# Patient Record
Sex: Male | Born: 1940
Health system: Southern US, Community
[De-identification: ages and names within clinical notes are randomized; demographics above are authoritative.]

## PROBLEM LIST (undated history)

## (undated) DIAGNOSIS — M1990 Unspecified osteoarthritis, unspecified site: Secondary | ICD-10-CM

## (undated) DIAGNOSIS — K219 Gastro-esophageal reflux disease without esophagitis: Secondary | ICD-10-CM

## (undated) DIAGNOSIS — H269 Unspecified cataract: Secondary | ICD-10-CM

## (undated) DIAGNOSIS — J189 Pneumonia, unspecified organism: Secondary | ICD-10-CM

## (undated) DIAGNOSIS — E7800 Pure hypercholesterolemia, unspecified: Secondary | ICD-10-CM

## (undated) DIAGNOSIS — C439 Malignant melanoma of skin, unspecified: Secondary | ICD-10-CM

## (undated) DIAGNOSIS — E119 Type 2 diabetes mellitus without complications: Secondary | ICD-10-CM

## (undated) DIAGNOSIS — F0390 Unspecified dementia without behavioral disturbance: Secondary | ICD-10-CM

## (undated) HISTORY — PX: HERNIA REPAIR: SHX51

## (undated) HISTORY — DX: Unspecified cataract: H26.9

## (undated) HISTORY — PX: CATARACT EXTRACTION, BILATERAL: SHX1313

## (undated) HISTORY — PX: GLAUCOMA SURGERY: SHX656

## (undated) HISTORY — DX: Unspecified osteoarthritis, unspecified site: M19.90

## (undated) HISTORY — DX: Gastro-esophageal reflux disease without esophagitis: K21.9

## (undated) HISTORY — DX: Pneumonia, unspecified organism: J18.9

## (undated) HISTORY — PX: TONSILLECTOMY: SUR1361

## (undated) HISTORY — PX: EYE SURGERY: SHX253

## (undated) HISTORY — DX: Pure hypercholesterolemia, unspecified: E78.00

---

## 2004-04-01 ENCOUNTER — Emergency Department (HOSPITAL_COMMUNITY): Admission: EM | Admit: 2004-04-01 | Discharge: 2004-04-01 | Payer: Self-pay | Admitting: Emergency Medicine

## 2004-04-01 ENCOUNTER — Inpatient Hospital Stay (HOSPITAL_COMMUNITY): Admission: EM | Admit: 2004-04-01 | Discharge: 2004-04-05 | Payer: Self-pay | Admitting: Psychiatry

## 2004-04-01 ENCOUNTER — Ambulatory Visit: Payer: Self-pay | Admitting: Psychiatry

## 2004-09-15 ENCOUNTER — Ambulatory Visit: Payer: Self-pay | Admitting: Family Medicine

## 2004-10-02 ENCOUNTER — Ambulatory Visit: Payer: Self-pay | Admitting: Family Medicine

## 2004-10-23 ENCOUNTER — Ambulatory Visit: Payer: Self-pay | Admitting: Family Medicine

## 2004-11-03 ENCOUNTER — Ambulatory Visit: Payer: Self-pay | Admitting: Family Medicine

## 2004-11-12 ENCOUNTER — Ambulatory Visit: Payer: Self-pay | Admitting: Family Medicine

## 2010-05-18 DIAGNOSIS — H269 Unspecified cataract: Secondary | ICD-10-CM

## 2010-05-18 HISTORY — DX: Unspecified cataract: H26.9

## 2013-11-15 ENCOUNTER — Encounter (HOSPITAL_COMMUNITY): Payer: Self-pay | Admitting: Emergency Medicine

## 2013-11-15 ENCOUNTER — Emergency Department (HOSPITAL_COMMUNITY): Payer: Medicare HMO

## 2013-11-15 ENCOUNTER — Emergency Department (HOSPITAL_COMMUNITY)
Admission: EM | Admit: 2013-11-15 | Discharge: 2013-11-15 | Disposition: A | Discharge status: Home / Self Care | Payer: Medicare HMO | Attending: Emergency Medicine | Admitting: Emergency Medicine

## 2013-11-15 DIAGNOSIS — W19XXXA Unspecified fall, initial encounter: Secondary | ICD-10-CM

## 2013-11-15 DIAGNOSIS — R296 Repeated falls: Secondary | ICD-10-CM | POA: Insufficient documentation

## 2013-11-15 DIAGNOSIS — S0081XA Abrasion of other part of head, initial encounter: Secondary | ICD-10-CM

## 2013-11-15 DIAGNOSIS — Z23 Encounter for immunization: Secondary | ICD-10-CM | POA: Insufficient documentation

## 2013-11-15 DIAGNOSIS — S022XXA Fracture of nasal bones, initial encounter for closed fracture: Secondary | ICD-10-CM | POA: Insufficient documentation

## 2013-11-15 DIAGNOSIS — Y9301 Activity, walking, marching and hiking: Secondary | ICD-10-CM | POA: Insufficient documentation

## 2013-11-15 DIAGNOSIS — IMO0002 Reserved for concepts with insufficient information to code with codable children: Secondary | ICD-10-CM | POA: Insufficient documentation

## 2013-11-15 DIAGNOSIS — E119 Type 2 diabetes mellitus without complications: Secondary | ICD-10-CM | POA: Insufficient documentation

## 2013-11-15 DIAGNOSIS — Y92410 Unspecified street and highway as the place of occurrence of the external cause: Secondary | ICD-10-CM | POA: Insufficient documentation

## 2013-11-15 HISTORY — DX: Type 2 diabetes mellitus without complications: E11.9

## 2013-11-15 LAB — URINALYSIS, ROUTINE W REFLEX MICROSCOPIC
Bilirubin Urine: NEGATIVE
Glucose, UA: >1000 mg/dL — AB
HGB URINE DIPSTICK: NEGATIVE
KETONES UR: NEGATIVE mg/dL
Leukocytes, UA: NEGATIVE
Nitrite: NEGATIVE
PROTEIN: 100 mg/dL — AB
Specific Gravity, Urine: 1.025 (ref 1.005–1.030)
UROBILINOGEN UA: 0.2 mg/dL (ref 0.0–1.0)
pH: 6 (ref 5.0–8.0)

## 2013-11-15 LAB — URINE MICROSCOPIC-ADD ON

## 2013-11-15 LAB — CBG MONITORING, ED: Glucose-Capillary: 266 mg/dL — ABNORMAL HIGH (ref 70–99)

## 2013-11-15 MED ORDER — "THROMBI-PAD 3""X3"" EX PADS"
1.0000 | MEDICATED_PAD | Freq: Once | CUTANEOUS | Status: AC
  Administered 2013-11-15: 1 via TOPICAL
  Filled 2013-11-15: qty 1

## 2013-11-15 MED ORDER — TETANUS-DIPHTH-ACELL PERTUSSIS 5-2.5-18.5 LF-MCG/0.5 IM SUSP
0.5000 mL | Freq: Once | INTRAMUSCULAR | Status: AC
  Administered 2013-11-15: 0.5 mL via INTRAMUSCULAR
  Filled 2013-11-15: qty 0.5

## 2013-11-15 MED ORDER — BACITRACIN ZINC 500 UNIT/GM EX OINT: 1.0000 "application " | TOPICAL_OINTMENT | Freq: Two times a day (BID) | CUTANEOUS | Status: DC

## 2013-11-15 NOTE — ED Provider Notes (Signed)
1150 - Patient care assumed from Charlann Lange, PA-C at shift change. Patient presenting after a mechanical fall. UA and imaging pending. Plan included d/c if imaging findings nonemergent. Imaging findings reviewed which are significant for nondisplaced L nasal bone fracture as well as L frontal scalp contusion. No other acute findings on imaging today. UA does not suggest infection. Patient has ambulated in the ED without difficulty or unsteady gait. He is stable for d/c at this time with wound care instructions. Return precautions provided and patient agreeable to plan with no unaddressed concerns.  Filed Vitals:   11/15/13 0910 11/15/13 1000 11/15/13 1025 11/15/13 1100  BP: 165/89 164/77 160/72 161/86  Pulse: 78 75 75 77  Temp:   97.6 F (36.4 C)   TempSrc:   Oral   Resp: 18 18 16 22   SpO2: 94% 93% 96% 97%   Results for orders placed during the hospital encounter of 11/15/13  URINALYSIS, ROUTINE W REFLEX MICROSCOPIC      Result Value Ref Range   Color, Urine YELLOW  YELLOW   APPearance CLEAR  CLEAR   Specific Gravity, Urine 1.025  1.005 - 1.030   pH 6.0  5.0 - 8.0   Glucose, UA >1000 (*) NEGATIVE mg/dL   Hgb urine dipstick NEGATIVE  NEGATIVE   Bilirubin Urine NEGATIVE  NEGATIVE   Ketones, ur NEGATIVE  NEGATIVE mg/dL   Protein, ur 100 (*) NEGATIVE mg/dL   Urobilinogen, UA 0.2  0.0 - 1.0 mg/dL   Nitrite NEGATIVE  NEGATIVE   Leukocytes, UA NEGATIVE  NEGATIVE  URINE MICROSCOPIC-ADD ON      Result Value Ref Range   Squamous Epithelial / LPF RARE  RARE   WBC, UA 0-2  <3 WBC/hpf   Casts HYALINE CASTS (*) NEGATIVE  CBG MONITORING, ED      Result Value Ref Range   Glucose-Capillary 266 (*) 70 - 99 mg/dL   Ct Head Wo Contrast  11/15/2013   CLINICAL DATA:  73 year old male with history of trauma from a fall. Injury to the head.  EXAM: CT HEAD WITHOUT CONTRAST  CT MAXILLOFACIAL WITHOUT CONTRAST  CT CERVICAL SPINE WITHOUT CONTRAST  TECHNIQUE: Multidetector CT imaging of the head, cervical  spine, and maxillofacial structures were performed using the standard protocol without intravenous contrast. Multiplanar CT image reconstructions of the cervical spine and maxillofacial structures were also generated.  COMPARISON:  No priors.  FINDINGS: CT HEAD FINDINGS  Soft tissue swelling in the left frontal scalp, compatible with a soft tissue contusion. This extends into the medial aspect of the left periorbital region. Mild cerebral atrophy. Patchy and confluent areas of decreased attenuation are noted throughout the deep and periventricular white matter of the cerebral hemispheres bilaterally, compatible with chronic microvascular ischemic disease. No acute displaced skull fractures are identified. No acute intracranial abnormality. Specifically, no evidence of acute post-traumatic intracranial hemorrhage, no definite regions of acute/subacute cerebral ischemia, no focal mass, mass effect, hydrocephalus or abnormal intra or extra-axial fluid collections. The visualized paranasal sinuses and mastoids are well pneumatized.  CT MAXILLOFACIAL FINDINGS  Soft tissue swelling in the left frontal scalp extending in the medial aspect of the left periorbital region. Left globe and retro-orbital soft tissues are grossly normal in appearance. No left orbital fracture. There is a nondisplaced fracture through the left nasal bones. No other acute displaced facial bone fractures are noted. Specifically, pterygoid plates are intact. Mandibular condyles are located. Paranasal sinuses are well pneumatized.  CT CERVICAL SPINE FINDINGS  No acute displaced  fractures of the cervical spine. Alignment is anatomic. Prevertebral soft tissues are normal. Mild multilevel degenerative disc disease, most pronounced at C2-C3 and C3-C4. Mild multilevel facet arthropathy. Visualized portions of the upper thorax are unremarkable.  IMPRESSION: 1. Nondisplaced fracture of the left nasal bones. 2. Contusion/laceration in the left frontal scalp  extending into the medial aspect of the left periorbital region. The left globe and retro-orbital soft tissues are grossly normal in appearance. 3. No other acute displaced facial bone fractures. 4. No acute displaced skull fracture or findings of significant acute intracranial trauma. 5. No evidence of significant acute traumatic injury to the cervical spine. 6. Mild cerebral atrophy with chronic microvascular ischemic changes in the cerebral white matter, as above. 7. Mild multilevel degenerative disc disease and cervical spondylosis, as above.   Electronically Signed   By: Vinnie Langton M.D.   On: 11/15/2013 10:45   Ct Cervical Spine Wo Contrast  11/15/2013   CLINICAL DATA:  73 year old male with history of trauma from a fall. Injury to the head.  EXAM: CT HEAD WITHOUT CONTRAST  CT MAXILLOFACIAL WITHOUT CONTRAST  CT CERVICAL SPINE WITHOUT CONTRAST  TECHNIQUE: Multidetector CT imaging of the head, cervical spine, and maxillofacial structures were performed using the standard protocol without intravenous contrast. Multiplanar CT image reconstructions of the cervical spine and maxillofacial structures were also generated.  COMPARISON:  No priors.  FINDINGS: CT HEAD FINDINGS  Soft tissue swelling in the left frontal scalp, compatible with a soft tissue contusion. This extends into the medial aspect of the left periorbital region. Mild cerebral atrophy. Patchy and confluent areas of decreased attenuation are noted throughout the deep and periventricular white matter of the cerebral hemispheres bilaterally, compatible with chronic microvascular ischemic disease. No acute displaced skull fractures are identified. No acute intracranial abnormality. Specifically, no evidence of acute post-traumatic intracranial hemorrhage, no definite regions of acute/subacute cerebral ischemia, no focal mass, mass effect, hydrocephalus or abnormal intra or extra-axial fluid collections. The visualized paranasal sinuses and mastoids  are well pneumatized.  CT MAXILLOFACIAL FINDINGS  Soft tissue swelling in the left frontal scalp extending in the medial aspect of the left periorbital region. Left globe and retro-orbital soft tissues are grossly normal in appearance. No left orbital fracture. There is a nondisplaced fracture through the left nasal bones. No other acute displaced facial bone fractures are noted. Specifically, pterygoid plates are intact. Mandibular condyles are located. Paranasal sinuses are well pneumatized.  CT CERVICAL SPINE FINDINGS  No acute displaced fractures of the cervical spine. Alignment is anatomic. Prevertebral soft tissues are normal. Mild multilevel degenerative disc disease, most pronounced at C2-C3 and C3-C4. Mild multilevel facet arthropathy. Visualized portions of the upper thorax are unremarkable.  IMPRESSION: 1. Nondisplaced fracture of the left nasal bones. 2. Contusion/laceration in the left frontal scalp extending into the medial aspect of the left periorbital region. The left globe and retro-orbital soft tissues are grossly normal in appearance. 3. No other acute displaced facial bone fractures. 4. No acute displaced skull fracture or findings of significant acute intracranial trauma. 5. No evidence of significant acute traumatic injury to the cervical spine. 6. Mild cerebral atrophy with chronic microvascular ischemic changes in the cerebral white matter, as above. 7. Mild multilevel degenerative disc disease and cervical spondylosis, as above.   Electronically Signed   By: Vinnie Langton M.D.   On: 11/15/2013 10:45   Ct Maxillofacial Wo Cm  11/15/2013   CLINICAL DATA:  73 year old male with history of trauma from a fall.  Injury to the head.  EXAM: CT HEAD WITHOUT CONTRAST  CT MAXILLOFACIAL WITHOUT CONTRAST  CT CERVICAL SPINE WITHOUT CONTRAST  TECHNIQUE: Multidetector CT imaging of the head, cervical spine, and maxillofacial structures were performed using the standard protocol without intravenous  contrast. Multiplanar CT image reconstructions of the cervical spine and maxillofacial structures were also generated.  COMPARISON:  No priors.  FINDINGS: CT HEAD FINDINGS  Soft tissue swelling in the left frontal scalp, compatible with a soft tissue contusion. This extends into the medial aspect of the left periorbital region. Mild cerebral atrophy. Patchy and confluent areas of decreased attenuation are noted throughout the deep and periventricular white matter of the cerebral hemispheres bilaterally, compatible with chronic microvascular ischemic disease. No acute displaced skull fractures are identified. No acute intracranial abnormality. Specifically, no evidence of acute post-traumatic intracranial hemorrhage, no definite regions of acute/subacute cerebral ischemia, no focal mass, mass effect, hydrocephalus or abnormal intra or extra-axial fluid collections. The visualized paranasal sinuses and mastoids are well pneumatized.  CT MAXILLOFACIAL FINDINGS  Soft tissue swelling in the left frontal scalp extending in the medial aspect of the left periorbital region. Left globe and retro-orbital soft tissues are grossly normal in appearance. No left orbital fracture. There is a nondisplaced fracture through the left nasal bones. No other acute displaced facial bone fractures are noted. Specifically, pterygoid plates are intact. Mandibular condyles are located. Paranasal sinuses are well pneumatized.  CT CERVICAL SPINE FINDINGS  No acute displaced fractures of the cervical spine. Alignment is anatomic. Prevertebral soft tissues are normal. Mild multilevel degenerative disc disease, most pronounced at C2-C3 and C3-C4. Mild multilevel facet arthropathy. Visualized portions of the upper thorax are unremarkable.  IMPRESSION: 1. Nondisplaced fracture of the left nasal bones. 2. Contusion/laceration in the left frontal scalp extending into the medial aspect of the left periorbital region. The left globe and retro-orbital  soft tissues are grossly normal in appearance. 3. No other acute displaced facial bone fractures. 4. No acute displaced skull fracture or findings of significant acute intracranial trauma. 5. No evidence of significant acute traumatic injury to the cervical spine. 6. Mild cerebral atrophy with chronic microvascular ischemic changes in the cerebral white matter, as above. 7. Mild multilevel degenerative disc disease and cervical spondylosis, as above.   Electronically Signed   By: Vinnie Langton M.D.   On: 11/15/2013 10:45      Antonietta Breach, PA-C 11/15/13 1214

## 2013-11-15 NOTE — ED Provider Notes (Signed)
Medical screening examination/treatment/procedure(s) were performed by non-physician practitioner and as supervising physician I was immediately available for consultation/collaboration.  Richarda Blade, MD 11/15/13 [phone]

## 2013-11-15 NOTE — ED Provider Notes (Signed)
CSN: [id number]     Arrival date & time 11/15/13  0856 History   First MD Initiated Contact with Patient 11/15/13 [phone]     Chief Complaint  Patient presents with  . Fall  . Head Laceration     (Consider location/radiation/quality/duration/timing/severity/associated sxs/prior Treatment) Patient is a 73 y.o. male presenting with fall and scalp laceration. The history is provided by the patient and the EMS personnel. No language interpreter was used.  Fall This is a new problem. The current episode started today. Pertinent negatives include no abdominal pain, chest pain, chills, fever, headaches or vomiting. Associated symptoms comments: He fell while walking and losing his balance. He walks with a cane and reports frequent loss of balance without dizziness, pain or shortness of breath. He fell face forward onto cement causing facial injury. No LOC, neck pain, chest/abdominal pain. He denies nausea or vomiting.Marland Kitchen  Head Laceration Pertinent negatives include no abdominal pain, chest pain, chills, fever, headaches or vomiting.    Past Medical History  Diagnosis Date  . Diabetes mellitus without complication    Past Surgical History  Procedure Laterality Date  . Hernia repair      double  . Tonsillectomy    . Eye surgery    . Cataract extraction, bilateral    . Glaucoma surgery     No family history on file. History  Substance Use Topics  . Smoking status: Never Smoker   . Smokeless tobacco: Not on file  . Alcohol Use: No    Review of Systems  Constitutional: Negative for fever and chills.  Respiratory: Negative.  Negative for shortness of breath.   Cardiovascular: Negative.  Negative for chest pain.  Gastrointestinal: Negative.  Negative for vomiting and abdominal pain.  Musculoskeletal: Negative.  Negative for back pain.  Skin: Positive for wound.  Neurological: Negative.  Negative for syncope, speech difficulty and headaches.      Allergies  Metformin and  related  Home Medications   Prior to Admission medications   Not on File   BP 165/89  Pulse 78  Temp(Src) 97.3 F (36.3 C) (Oral)  Resp 18  SpO2 94% Physical Exam  Constitutional: He is oriented to person, place, and time. He appears well-developed and well-nourished. No distress.  HENT:  Head: Normocephalic.  Facial abrasions over left eye, left lateral nose and upper lip. Minimal swelling or tenderness.   Eyes: Conjunctivae are normal. Pupils are equal, round, and reactive to light.  Neck: Normal range of motion. Neck supple.  Cardiovascular: Normal rate and regular rhythm.   Pulmonary/Chest: Effort normal and breath sounds normal.  Abdominal: Soft. Bowel sounds are normal. There is no tenderness. There is no rebound and no guarding.  Musculoskeletal: Normal range of motion.  No midline or paraspinal tenderness to palpation. No swelling of neck or back. FROM all extremities  Neurological: He is alert and oriented to person, place, and time.  Skin: Skin is warm and dry.  Psychiatric: He has a normal mood and affect.    ED Course  Procedures (including critical care time) Labs Review Labs Reviewed - No data to display  Imaging Review No results found.   EKG Interpretation None      MDM   Final diagnoses:  None    1. Fall 2. Facial abrasions  Nonsuturable abrasions to face. CT's pending to evaluate for head/neck injury or facial fractures. Patient care transferred to Dr. Eulis Foster pending CT results.     Dewaine Oats, PA-C 11/15/13 1013

## 2013-11-15 NOTE — ED Notes (Signed)
Notified RN of CBG 266

## 2013-11-15 NOTE — ED Notes (Signed)
73 yo male walking on [address]. Feel on the side walk and hit head. No LOC, recalls event. Left Eyebrow 1/2 lac bleeding not on any blood thinners. Pressure dressing applied via EMS. Hx Diabetes.A/O x3    160/80 CBG 136 post meal

## 2013-11-15 NOTE — ED Provider Notes (Signed)
Medical screening examination/treatment/procedure(s) were performed by non-physician practitioner and as supervising physician I was immediately available for consultation/collaboration.  Richarda Blade, MD 11/15/13 [phone]

## 2013-11-15 NOTE — ED Notes (Signed)
Patient transported to CT 

## 2013-11-15 NOTE — Discharge Instructions (Signed)
Recommend topical bacitracin as prescribed for proper wound healing. Recommend Tylenol as needed for pain control. Recommend ice to areas of injury to prevent swelling and to improve pain. Followup with your primary doctor.  Abrasions An abrasion is a cut or scrape of the skin. Abrasions do not go through all layers of the skin. HOME CARE  If a bandage (dressing) was put on your wound, change it as told by your doctor. If the bandage sticks, soak it off with warm.  Wash the area with water and soap 2 times a day. Rinse off the soap. Pat the area dry with a clean towel.  Put on medicated cream (ointment) as told by your doctor.  Change your bandage right away if it gets wet or dirty.  Only take medicine as told by your doctor.  See your doctor within 24-48 hours to get your wound checked.  Check your wound for redness, puffiness (swelling), or yellowish-white fluid (pus). GET HELP RIGHT AWAY IF:   You have more pain in the wound.  You have redness, swelling, or tenderness around the wound.  You have pus coming from the wound.  You have a fever or lasting symptoms for more than 2-3 days.  You have a fever and your symptoms suddenly get worse.  You have a bad smell coming from the wound or bandage. MAKE SURE YOU:   Understand these instructions.  Will watch your condition.  Will get help right away if you are not doing well or get worse. Document Released: 10/21/2007 Document Revised: 01/27/2012 Document Reviewed: 04/07/2011 [facility] Patient Information 2015 [place], [place]. This information is not intended to replace advice given to you by your health care provider. Make sure you discuss any questions you have with your health care provider.  Wound Care Wound care helps prevent pain and infection.  You may need a tetanus shot if:  You cannot remember when you had your last tetanus shot.  You have never had a tetanus shot.  The injury broke your skin. If you need a  tetanus shot and you choose not to have one, you may get tetanus. Sickness from tetanus can be serious. HOME CARE   Only take medicine as told by your doctor.  Clean the wound daily with mild soap and water.  Change any bandages (dressings) as told by your doctor.  Put medicated cream and a bandage on the wound as told by your doctor.  Change the bandage if it gets wet, dirty, or starts to smell.  Take showers. Do not take baths, swim, or do anything that puts your wound under water.  Rest and raise (elevate) the wound until the pain and puffiness (swelling) are better.  Keep all doctor visits as told. GET HELP RIGHT AWAY IF:   Yellowish-white fluid (pus) comes from the wound.  Medicine does not lessen your pain.  There is a red streak going away from the wound.  You have a fever. MAKE SURE YOU:   Understand these instructions.  Will watch your condition.  Will get help right away if you are not doing well or get worse. Document Released: 02/11/2008 Document Revised: 07/27/2011 Document Reviewed: 09/07/2010 [facility] Patient Information 2015 [place], [place]. This information is not intended to replace advice given to you by your health care provider. Make sure you discuss any questions you have with your health care provider.

## 2014-04-24 ENCOUNTER — Emergency Department (HOSPITAL_COMMUNITY): Payer: Medicare HMO

## 2014-04-24 ENCOUNTER — Inpatient Hospital Stay (HOSPITAL_COMMUNITY)
Admission: EM | Admit: 2014-04-24 | Discharge: 2014-04-30 | DRG: 871 | Disposition: A | Discharge status: Home Health | Payer: Medicare HMO | Attending: Internal Medicine | Admitting: Internal Medicine

## 2014-04-24 ENCOUNTER — Encounter (HOSPITAL_COMMUNITY): Payer: Self-pay

## 2014-04-24 ENCOUNTER — Inpatient Hospital Stay (HOSPITAL_COMMUNITY): Payer: Medicare HMO

## 2014-04-24 DIAGNOSIS — H409 Unspecified glaucoma: Secondary | ICD-10-CM | POA: Diagnosis not present

## 2014-04-24 DIAGNOSIS — E785 Hyperlipidemia, unspecified: Secondary | ICD-10-CM | POA: Diagnosis present

## 2014-04-24 DIAGNOSIS — E7220 Disorder of urea cycle metabolism, unspecified: Secondary | ICD-10-CM | POA: Diagnosis not present

## 2014-04-24 DIAGNOSIS — E872 Acidosis: Secondary | ICD-10-CM | POA: Diagnosis present

## 2014-04-24 DIAGNOSIS — R680 Hypothermia, not associated with low environmental temperature: Secondary | ICD-10-CM | POA: Diagnosis not present

## 2014-04-24 DIAGNOSIS — J189 Pneumonia, unspecified organism: Secondary | ICD-10-CM

## 2014-04-24 DIAGNOSIS — Z794 Long term (current) use of insulin: Secondary | ICD-10-CM | POA: Diagnosis not present

## 2014-04-24 DIAGNOSIS — G9340 Encephalopathy, unspecified: Secondary | ICD-10-CM | POA: Diagnosis present

## 2014-04-24 DIAGNOSIS — R451 Restlessness and agitation: Secondary | ICD-10-CM | POA: Diagnosis not present

## 2014-04-24 DIAGNOSIS — E162 Hypoglycemia, unspecified: Secondary | ICD-10-CM | POA: Diagnosis present

## 2014-04-24 DIAGNOSIS — J690 Pneumonitis due to inhalation of food and vomit: Secondary | ICD-10-CM

## 2014-04-24 DIAGNOSIS — A419 Sepsis, unspecified organism: Principal | ICD-10-CM

## 2014-04-24 DIAGNOSIS — E11649 Type 2 diabetes mellitus with hypoglycemia without coma: Secondary | ICD-10-CM | POA: Diagnosis not present

## 2014-04-24 DIAGNOSIS — T68XXXA Hypothermia, initial encounter: Secondary | ICD-10-CM

## 2014-04-24 DIAGNOSIS — Z85820 Personal history of malignant melanoma of skin: Secondary | ICD-10-CM

## 2014-04-24 DIAGNOSIS — E119 Type 2 diabetes mellitus without complications: Secondary | ICD-10-CM

## 2014-04-24 HISTORY — DX: Malignant melanoma of skin, unspecified: C43.9

## 2014-04-24 LAB — ETHANOL

## 2014-04-24 LAB — CBC WITH DIFFERENTIAL/PLATELET
Basophils Absolute: 0 10*3/uL (ref 0.0–0.1)
Basophils Relative: 0 % (ref 0–1)
EOS ABS: 0 10*3/uL (ref 0.0–0.7)
Eosinophils Relative: 0 % (ref 0–5)
HCT: 58 % — ABNORMAL HIGH (ref 39.0–52.0)
HEMOGLOBIN: 20.9 g/dL — AB (ref 13.0–17.0)
Lymphocytes Relative: 4 % — ABNORMAL LOW (ref 12–46)
Lymphs Abs: 0.8 10*3/uL (ref 0.7–4.0)
MCH: 30.4 pg (ref 26.0–34.0)
MCHC: 36 g/dL (ref 30.0–36.0)
MCV: 84.3 fL (ref 78.0–100.0)
MONOS PCT: 8 % (ref 3–12)
Monocytes Absolute: 1.8 10*3/uL — ABNORMAL HIGH (ref 0.1–1.0)
NEUTROS PCT: 88 % — AB (ref 43–77)
Neutro Abs: 20.4 10*3/uL — ABNORMAL HIGH (ref 1.7–7.7)
Platelets: 189 10*3/uL (ref 150–400)
RBC: 6.88 MIL/uL — AB (ref 4.22–5.81)
RDW: 13.8 % (ref 11.5–15.5)
WBC: 23.1 10*3/uL — ABNORMAL HIGH (ref 4.0–10.5)

## 2014-04-24 LAB — RAPID URINE DRUG SCREEN, HOSP PERFORMED
AMPHETAMINES: NOT DETECTED
Barbiturates: NOT DETECTED
Benzodiazepines: NOT DETECTED
Cocaine: NOT DETECTED
OPIATES: NOT DETECTED
TETRAHYDROCANNABINOL: NOT DETECTED

## 2014-04-24 LAB — GLUCOSE, CAPILLARY
GLUCOSE-CAPILLARY: 57 mg/dL — AB (ref 70–99)
GLUCOSE-CAPILLARY: 78 mg/dL (ref 70–99)
Glucose-Capillary: 73 mg/dL (ref 70–99)
Glucose-Capillary: 55 mg/dL — ABNORMAL LOW (ref 70–99)
Glucose-Capillary: 134 mg/dL — ABNORMAL HIGH (ref 70–99)
Glucose-Capillary: 104 mg/dL — ABNORMAL HIGH (ref 70–99)
Glucose-Capillary: 177 mg/dL — ABNORMAL HIGH (ref 70–99)

## 2014-04-24 LAB — I-STAT ARTERIAL BLOOD GAS, ED
ACID-BASE DEFICIT: 7 mmol/L — AB (ref 0.0–2.0)
Bicarbonate: 18 mEq/L — ABNORMAL LOW (ref 20.0–24.0)
O2 Saturation: 90 %
Patient temperature: 95.8
TCO2: 19 mmol/L (ref 0–100)
pCO2 arterial: 33.8 mmHg — ABNORMAL LOW (ref 35.0–45.0)
pH, Arterial: 7.327 — ABNORMAL LOW (ref 7.350–7.450)
pO2, Arterial: 58 mmHg — ABNORMAL LOW (ref 80.0–100.0)

## 2014-04-24 LAB — COMPREHENSIVE METABOLIC PANEL
ALK PHOS: 115 U/L (ref 39–117)
ALT: 92 U/L — ABNORMAL HIGH (ref 0–53)
ANION GAP: 16 — AB (ref 5–15)
AST: 64 U/L — ABNORMAL HIGH (ref 0–37)
Albumin: 3.5 g/dL (ref 3.5–5.2)
BUN: 26 mg/dL — AB (ref 6–23)
CO2: 23 mEq/L (ref 19–32)
CREATININE: 0.89 mg/dL (ref 0.50–1.35)
Calcium: 8.8 mg/dL (ref 8.4–10.5)
Chloride: 101 mEq/L (ref 96–112)
GFR calc non Af Amer: 84 mL/min — ABNORMAL LOW (ref >90)
Glucose, Bld: 123 mg/dL — ABNORMAL HIGH (ref 70–99)
Potassium: 4.2 mEq/L (ref 3.7–5.3)
Sodium: 140 mEq/L (ref 137–147)
TOTAL PROTEIN: 7.2 g/dL (ref 6.0–8.3)
Total Bilirubin: 0.7 mg/dL (ref 0.3–1.2)

## 2014-04-24 LAB — URINALYSIS, ROUTINE W REFLEX MICROSCOPIC
Bilirubin Urine: NEGATIVE
Glucose, UA: 250 mg/dL — AB
Ketones, ur: NEGATIVE mg/dL
Leukocytes, UA: NEGATIVE
NITRITE: NEGATIVE
Specific Gravity, Urine: 1.021 (ref 1.005–1.030)
Urobilinogen, UA: 0.2 mg/dL (ref 0.0–1.0)
pH: 6.5 (ref 5.0–8.0)

## 2014-04-24 LAB — AMMONIA: Ammonia: 79 umol/L — ABNORMAL HIGH (ref 11–60)

## 2014-04-24 LAB — URINE MICROSCOPIC-ADD ON

## 2014-04-24 LAB — MRSA PCR SCREENING: MRSA BY PCR: NEGATIVE

## 2014-04-24 LAB — I-STAT CG4 LACTIC ACID, ED
LACTIC ACID, VENOUS: 2.88 mmol/L — AB (ref 0.5–2.2)
Lactic Acid, Venous: 2.9 mmol/L — ABNORMAL HIGH (ref 0.5–2.2)
Lactic Acid, Venous: 2.73 mmol/L — ABNORMAL HIGH (ref 0.5–2.2)

## 2014-04-24 LAB — STREP PNEUMONIAE URINARY ANTIGEN: STREP PNEUMO URINARY ANTIGEN: NEGATIVE

## 2014-04-24 LAB — HEMOGLOBIN A1C
Hgb A1c MFr Bld: 10.1 % — ABNORMAL HIGH (ref <5.7)
Mean Plasma Glucose: 243 mg/dL — ABNORMAL HIGH (ref <117)

## 2014-04-24 LAB — ACETAMINOPHEN LEVEL

## 2014-04-24 LAB — SALICYLATE LEVEL

## 2014-04-24 LAB — TROPONIN I: Troponin I: <0.3 ng/mL (ref <0.30)

## 2014-04-24 LAB — CBG MONITORING, ED: GLUCOSE-CAPILLARY: 131 mg/dL — AB (ref 70–99)

## 2014-04-24 LAB — TSH: TSH: 0.641 u[IU]/mL (ref 0.350–4.500)

## 2014-04-24 LAB — LIPASE, BLOOD: Lipase: 20 U/L (ref 11–59)

## 2014-04-24 LAB — PRO B NATRIURETIC PEPTIDE: PRO B NATRI PEPTIDE: 283.9 pg/mL — AB (ref 0–125)

## 2014-04-24 MED ORDER — LORAZEPAM 2 MG/ML IJ SOLN
INTRAMUSCULAR | Status: AC
  Administered 2014-04-24: 1 mg
  Filled 2014-04-24: qty 1

## 2014-04-24 MED ORDER — LORAZEPAM 2 MG/ML IJ SOLN
1.0000 mg | Freq: Once | INTRAMUSCULAR | Status: AC | PRN
  Administered 2014-04-24: 1 mg via INTRAVENOUS
  Filled 2014-04-24: qty 1

## 2014-04-24 MED ORDER — DEXTROSE 5 % IV SOLN
500.0000 mg | Freq: Once | INTRAVENOUS | Status: AC
  Administered 2014-04-24: 500 mg via INTRAVENOUS
  Filled 2014-04-24: qty 500

## 2014-04-24 MED ORDER — ONDANSETRON HCL 4 MG PO TABS: 4.0000 mg | ORAL_TABLET | Freq: Four times a day (QID) | ORAL | Status: DC | PRN

## 2014-04-24 MED ORDER — DEXTROSE 50 % IV SOLN
INTRAVENOUS | Status: AC
  Administered 2014-04-24: 50 mL
  Filled 2014-04-24: qty 50

## 2014-04-24 MED ORDER — SODIUM CHLORIDE 0.9 % IV SOLN
3.0000 g | Freq: Four times a day (QID) | INTRAVENOUS | Status: DC
  Administered 2014-04-24 – 2014-04-29 (×18): 3 g via INTRAVENOUS
  Filled 2014-04-24 (×24): qty 3

## 2014-04-24 MED ORDER — DEXTROSE 50 % IV SOLN
INTRAVENOUS | Status: AC
  Filled 2014-04-24: qty 50

## 2014-04-24 MED ORDER — DEXTROSE 50 % IV SOLN: 50.0000 mL | Freq: Once | INTRAVENOUS | Status: AC

## 2014-04-24 MED ORDER — SODIUM CHLORIDE 0.9 % IV BOLUS (SEPSIS)
1000.0000 mL | Freq: Once | INTRAVENOUS | Status: AC
  Administered 2014-04-24: 1000 mL via INTRAVENOUS

## 2014-04-24 MED ORDER — SODIUM CHLORIDE 0.9 % IV SOLN
INTRAVENOUS | Status: DC
  Administered 2014-04-24: 13:00:00 via INTRAVENOUS

## 2014-04-24 MED ORDER — ACETAMINOPHEN 325 MG PO TABS
650.0000 mg | ORAL_TABLET | Freq: Four times a day (QID) | ORAL | Status: DC | PRN
  Filled 2014-04-24: qty 2

## 2014-04-24 MED ORDER — DEXTROSE 50 % IV SOLN
1.0000 | Freq: Once | INTRAVENOUS | Status: AC
  Administered 2014-04-24: 50 mL via INTRAVENOUS

## 2014-04-24 MED ORDER — ACETAMINOPHEN 650 MG RE SUPP: 650.0000 mg | Freq: Four times a day (QID) | RECTAL | Status: DC | PRN

## 2014-04-24 MED ORDER — ONDANSETRON HCL 4 MG/2ML IJ SOLN: 4.0000 mg | Freq: Four times a day (QID) | INTRAMUSCULAR | Status: DC | PRN

## 2014-04-24 MED ORDER — DEXTROSE 5 % IV SOLN
500.0000 mg | INTRAVENOUS | Status: DC
  Administered 2014-04-25: 500 mg via INTRAVENOUS
  Filled 2014-04-24 (×2): qty 500

## 2014-04-24 MED ORDER — DEXTROSE-NACL 5-0.45 % IV SOLN
INTRAVENOUS | Status: DC
  Administered 2014-04-24: 18:00:00 via INTRAVENOUS

## 2014-04-24 MED ORDER — VANCOMYCIN HCL IN DEXTROSE 1-5 GM/200ML-% IV SOLN
1000.0000 mg | Freq: Once | INTRAVENOUS | Status: AC
  Administered 2014-04-24: 1000 mg via INTRAVENOUS
  Filled 2014-04-24: qty 200

## 2014-04-24 MED ORDER — DEXTROSE 5 % IV SOLN
1.0000 g | Freq: Once | INTRAVENOUS | Status: AC
  Administered 2014-04-24: 1 g via INTRAVENOUS
  Filled 2014-04-24: qty 10

## 2014-04-24 MED ORDER — CHLORHEXIDINE GLUCONATE 0.12 % MT SOLN
15.0000 mL | Freq: Two times a day (BID) | OROMUCOSAL | Status: DC
  Administered 2014-04-24 – 2014-04-27 (×5): 15 mL via OROMUCOSAL
  Filled 2014-04-24 (×10): qty 15

## 2014-04-24 MED ORDER — ENOXAPARIN SODIUM 40 MG/0.4ML ~~LOC~~ SOLN
40.0000 mg | SUBCUTANEOUS | Status: DC
  Administered 2014-04-24 – 2014-04-29 (×6): 40 mg via SUBCUTANEOUS
  Filled 2014-04-24 (×8): qty 0.4

## 2014-04-24 MED ORDER — HALOPERIDOL LACTATE 5 MG/ML IJ SOLN: 2.0000 mg | Freq: Once | INTRAMUSCULAR | Status: DC

## 2014-04-24 MED ORDER — CETYLPYRIDINIUM CHLORIDE 0.05 % MT LIQD
7.0000 mL | Freq: Two times a day (BID) | OROMUCOSAL | Status: DC
  Administered 2014-04-25 – 2014-04-27 (×4): 7 mL via OROMUCOSAL

## 2014-04-24 NOTE — Procedures (Signed)
EEG report.  Brief clinical history:  73 y/o admitted to [facility] due to altered mental status.  Technique: this is a 17 channel routine scalp EEG performed at the bedside with bipolar and monopolar montages arranged in accordance to the international 10/20 system of electrode placement. One channel was dedicated to EKG recording.  Patient is poorly responsive during the study No activating procedures performed.  Description: as the study begins and throughout the entire recording, there is generalized, continuous, rather monomorphic but reactive theta slowing that at times is followed by short periods of diffuse attenuation lasting up to 3 seconds. This slowing doesn't follow an ictal pattern at any time. No focal or generalized epileptiform discharges noted.  EKG showed sinus rhythm.  Impression: this is an abnormal EEG with findings consistent with a mild to moderate encephalopathy, non specific as to cause. No electrographic seizures noted. Clinical correlation is advised.   Dorian Pod, MD

## 2014-04-24 NOTE — ED Notes (Signed)
Attempted report 

## 2014-04-24 NOTE — ED Notes (Signed)
Patient placed on warming device (bear hugger).

## 2014-04-24 NOTE — ED Notes (Signed)
Lactic Acid results reported to Dr.Pfeiffer. ED-Lab.

## 2014-04-24 NOTE — Progress Notes (Signed)
Pt arrived to unit from ED accompanied by RN. VS stable. Pt continue to be responsive to pain, nonverbal. Pt noted to have red, hot rash on face, neck and chest; Dr. Conley Canal notified, no new orders received. Awaiting STAT MRI. Will continue to monitor.

## 2014-04-24 NOTE — ED Notes (Signed)
NOTIFIED WENDY CHILDRESS ,RN IN PERSON FOR  PATIENTS PANIC LAB RESULTS OF CG4+ LACTIC ACID = 2.73 mmol/L ,@11 :40 AM ,04/24/2014.

## 2014-04-24 NOTE — Progress Notes (Signed)
Utilization review completed.  

## 2014-04-24 NOTE — ED Notes (Signed)
Patient presents today from home via EMS who reports patient was found unresponsive this morning. Last seen normal 10:30pm last night by ex-wife who saw him go to bed. EMS noted initial CBG to be 50, gave one AMP of D50, CBG on arrival 130, patient responds to pain at this time.

## 2014-04-24 NOTE — ED Notes (Signed)
Dr.Pfeiffer given results of lactic Acid. ED-Lab.

## 2014-04-24 NOTE — H&P (Signed)
[facility] History and Physical  Larri Yehle PRF:[id number] DOB: 29-Apr-1941 DOA: 04/24/2014  Referring physician: Johnney Killian PCP: Antony Blackbird, MD   Chief Complaint: unresponsive  HPI: Stephen Frost is a 73 y.o. male  With history of insulin-dependent diabetes who came to the emergency room after a family member found him unresponsive this morning. He was last seen normal last night at about 10:30 PM. This morning at about 7, his ex-wife was unable to rouse him out of bed. When EMS arrived, his blood glucose was 50.  He was given D50 en route. In the emergency room, his blood glucose was 130. ABG shows mild metabolic acidosis and increased 80 A-a gradient. White blood cell count is 23,000. Hemoglobin 20.9. Hematocrit 58. Venous lactic acid 2.7. Chest x-ray shows possible by basilar infiltrates. CT brain shows nothing acute. Rectal temperature was 95.8.  Heart rate and blood pressure normal. family reports that he was recently started and a new type of insulin a week ago.  Since then, he reported having frequent lows. There were no reports of fever, chills, headache, cough yesterday.  Patient has received antibiotics and fluid boluses for presumed pneumonia with sepsis. Mental status has not improved. He has no history of seizures, alcohol or drug abuse.  Had recent surgery for left eye retinal detachment. Also recently had biopsy of a lesion on his foot which came back as melanoma. He has an appointment with someone at [facility] for follow-up.   Review of Systems:  Unable due to patient factors  Past Medical History  Diagnosis Date  . Diabetes mellitus without complication   . Melanoma    Past Surgical History  Procedure Laterality Date  . Hernia repair      double  . Tonsillectomy    . Eye surgery    . Cataract extraction, bilateral    . Glaucoma surgery     Social History:  reports that he has never smoked. He does not have any smokeless tobacco history on file. He reports that  he does not drink alcohol or use illicit drugs.  Allergies  Allergen Reactions  . Metformin And Related Diarrhea    Family History  Problem Relation Age of Onset  . Alcoholism Mother      Prior to Admission medications   Medication Sig Start Date End Date Taking? Authorizing Provider  bacitracin ointment Apply 1 application topically 2 (two) times daily. Apply to your abrasions to improve wound healing and prevent infection. 11/15/13   Antonietta Breach, PA-C  COMBIGAN 0.2-0.5 % ophthalmic solution  04/15/14   Historical Provider, MD  HUMALOG 100 UNIT/ML injection  04/17/14   Historical Provider, MD  insulin glargine (LANTUS) 100 UNIT/ML injection Inject 50 Units into the skin 2 (two) times daily.    Historical Provider, MD  lisinopril (PRINIVIL,ZESTRIL) 20 MG tablet Take 20 mg by mouth daily. 04/10/14   Historical Provider, MD   Physical Exam: Filed Vitals:   04/24/14 1000 04/24/14 1020 04/24/14 1030 04/24/14 1045  BP: 152/77  154/88   Pulse: 91  95 105  Temp:  98.4 F (36.9 C)    TempSrc:  Rectal    Resp: 22  26 24   SpO2: 96%  97% 96%    Wt Readings from Last 3 Encounters:  No data found for Wt    BP 123/68 mmHg  Pulse 99  Temp(Src) 98.5 F (36.9 C) (Axillary)  Resp 23  Ht 5\' 9"  (1.753 m)  Wt 70.4 kg (155 lb 3.3 oz)  BMI  22.91 kg/m2  SpO2 95%  General Appearance:    will open eyes to noxious stimuli but not follow commands or answer questions.   Head:    Normocephalic, without obvious abnormality, atraumatic  Eyes:    left eye red and pupil smaller than the right. Both are reactive. Extraocular movements appear to be intact.       Nose:   Nares normal, septum midline, mucosa normal, no drainage  Throat:   Lips, mucosa, and tongue normal; teeth and gums normal. oropharynx clear   Neck:   Supple, symmetrical, trachea midline, no adenopathy;       thyroid:  No enlargement/tenderness/nodules; no carotid   bruit or JVD  Lungs:     bilateral rhonchi without wheezes or rales     Chest wall:    No tenderness or deformity  Heart:    Regular rate and rhythm, S1 and S2 normal, no murmur, rub   or gallop  Abdomen:     Soft, non-tender, bowel sounds active all four quadrants,    no masses, no organomegaly  Genitalia:    blood from the urethral orifice after in and out cath   Rectal:    deferred   Extremities:   Extremities normal, atraumatic, no cyanosis or edema  Pulses:   2+ and symmetric all extremities  Skin:   Skin color, texture, turgor normal, no rashes or lesions  Lymph nodes:   Cervical, supraclavicular, and axillary nodes normal  Neurologic:   no obvious cranial nerve deficits. Left pupil size difference consistent with recent eye surgery. Moves all extremities purposefully. Deep tendon reflexes equal. Babinski's negative.             Labs on Admission:  Basic Metabolic Panel:  Recent Labs Lab 04/24/14 0830  NA 140  K 4.2  CL 101  CO2 23  GLUCOSE 123*  BUN 26*  CREATININE 0.89  CALCIUM 8.8   Liver Function Tests:  Recent Labs Lab 04/24/14 0830  AST 64*  ALT 92*  ALKPHOS 115  BILITOT 0.7  PROT 7.2  ALBUMIN 3.5    Recent Labs Lab 04/24/14 0830  LIPASE 20   No results for input(s): AMMONIA in the last 168 hours. CBC:  Recent Labs Lab 04/24/14 0830  WBC 23.1*  NEUTROABS 20.4*  HGB 20.9*  HCT 58.0*  MCV 84.3  PLT 189   Cardiac Enzymes: No results for input(s): CKTOTAL, CKMB, CKMBINDEX, TROPONINI in the last 168 hours.  BNP (last 3 results)  Recent Labs  04/24/14 0830  PROBNP 283.9*   CBG: No results for input(s): GLUCAP in the last 168 hours.  Radiological Exams on Admission: Ct Head Wo Contrast  04/24/2014   CLINICAL DATA:  73 year old diabetic male found unresponsive this morning. Initial encounter.  EXAM: CT HEAD WITHOUT CONTRAST  TECHNIQUE: Contiguous axial images were obtained from the base of the skull through the vertex without intravenous contrast.  COMPARISON:  11/15/2013.  FINDINGS: No intracranial  hemorrhage.  Small vessel disease type changes.  Left carotid terminus and M1 segment (bilaterally) of the middle cerebral artery appear dense and without change. This limits evaluation for detection of large acute infarct. No other CT evidence to suggest large acute infarct. If infarct remains of high clinical concern, MR imaging may be considered.  Mild atrophy. Mild ventricular prominence felt to be related to atrophy rather than hydrocephalus and without change.  No intracranial mass lesion noted on this unenhanced exam.  Vascular calcifications.  Opacification left sphenoid sinus.  Prior fracture right maxilla/zygomatic arch in an inferior right orbital floor.  IMPRESSION: No intracranial hemorrhage.  Small vessel disease type changes.  No CT evidence of large acute infarct. If infarct is of high clinical concern, MR imaging may be considered.  Opacification left sphenoid sinus.   Electronically Signed   By: Chauncey Cruel M.D.   On: 04/24/2014 09:07   Dg Chest Port 1 View  04/24/2014   CLINICAL DATA:  Confusion  EXAM: PORTABLE CHEST - 1 VIEW  COMPARISON:  None.  FINDINGS: Heart size upper normal.  Negative for heart failure.  Mild bibasilar airspace disease may represent pneumonia or atelectasis. No effusion. Hypoventilation with decreased lung volume.  IMPRESSION: Hypoventilation with bibasilar atelectasis/ infiltrate.   Electronically Signed   By: Franchot Gallo M.D.   On: 04/24/2014 08:25    EKG: Tracing reviewed. Sinus rhythm Probable left atrial enlargement Right axis deviation Probable anteroseptal infarct, old  Assessment/Plan Principal Problem:   Encephalopathy acute:  Suspect prolonged period of unwitnessed hypoglycemia with resulting encephalopathy, possibly aspiration pneumonia with sepsis. May have had a hypoglycemic seizure. Will check EEG. Will also check stat MRI to rule out stroke PRES, encephalitis. Will check TSH, ammonia level. Monitor in step down unit.  If does not improve  within 24 hours, may need neurology consult.  Neck is supple and no reports of headache so doubt meningitis. Active Problems:   Hypoglycemia:  Resolved. Blood glucose was 50 when EMS arrived, but he may have been lower and or prolonged overnight. Monitor CBGs frequently. If any further lows, would start a dextrose drip.  Will also check cortisol level.    probable Aspiration pneumonia with sepsis: will give Unasyn for anaerobic and gram-negative coverage. Azithromycin in case community-acquired pneumonia.   Hypothermia: Corrected with bear hugger. Would be consistent with prolonged hypoglycemia. TSH ordered.   DM type 2 (diabetes mellitus, type 2): Hold insulin for now. Check hemoglobin A1c.   Code Status: full Family Communication: daughter and ex wife at bedside   Time spent: 73 min  [place] [email or web address]

## 2014-04-24 NOTE — ED Provider Notes (Signed)
CSN: [id number]     Arrival date & time 04/24/14  0759 History   First MD Initiated Contact with Patient 04/24/14 [phone]     Chief Complaint  Patient presents with  . Hypoglycemia     (Consider location/radiation/quality/duration/timing/severity/associated sxs/prior Treatment) HPI  The patient lives with his ex-wife. She is the historian. She reports she rents a room from him and sees him frequently however is not well acquainted with his medical problems. She reports she last saw him at 9 PM yesterday evening. She reports he was normal appearance at that time. His normal baseline is to be active with normal cognitive function. She denies he had any complaints of headache chest pain or general illness. This morning she found him to be unresponsive. He was in his bed and hardly speaking at all. EMS was contacted. Upon arrival they identified a CBG of 50 and administered an amp of D50. They report to the response to that was some eye opening. The patient was not hypotensive upon arrival nor was he in active respiratory distress. Thus he was transported to the emergency department. There is no history of acute injury and no history of incremental illness. The patient is giving no history.  Family at history that the patient had a retinal detachment repair 2 weeks ago. He's had some mild left-sided scleral injection since that time. This is not a new finding. He has not been having any complaints regarding that.  Past Medical History  Diagnosis Date  . Diabetes mellitus without complication   . Melanoma    Past Surgical History  Procedure Laterality Date  . Hernia repair      double  . Tonsillectomy    . Eye surgery    . Cataract extraction, bilateral    . Glaucoma surgery     Family History  Problem Relation Age of Onset  . Alcoholism Mother    History  Substance Use Topics  . Smoking status: Never Smoker   . Smokeless tobacco: Not on file  . Alcohol Use: No    Review of  Systems Unable to obtain due to patient condition. The general review of systems is from the patient's wife as outlined above.   Allergies  Metformin and related  Home Medications   Prior to Admission medications   Medication Sig Start Date End Date Taking? Authorizing Provider  atorvastatin (LIPITOR) 80 MG tablet Take 40 mg by mouth at bedtime.    Historical Provider, MD  bacitracin ointment Apply 1 application topically 2 (two) times daily. Apply to your abrasions to improve wound healing and prevent infection. Patient not taking: Reported on 04/25/2014 11/15/13   Antonietta Breach, PA-C  COMBIGAN 0.2-0.5 % ophthalmic solution  04/15/14   Historical Provider, MD  gabapentin (NEURONTIN) 800 MG tablet Take 800 mg by mouth 3 (three) times daily.    Historical Provider, MD  HUMALOG 100 UNIT/ML injection Inject into the skin.  04/17/14   Historical Provider, MD  insulin glargine (LANTUS) 100 UNIT/ML injection Inject 50 Units into the skin 2 (two) times daily.    Historical Provider, MD  lisinopril (PRINIVIL,ZESTRIL) 20 MG tablet Take 20 mg by mouth daily. 04/10/14   Historical Provider, MD  prednisoLONE acetate (PRED FORTE) 1 % ophthalmic suspension     Historical Provider, MD   BP 162/92 mmHg  Pulse 82  Temp(Src) 97.8 F (36.6 C) (Oral)  Resp 21  Ht 5\' 9"  (1.753 m)  Wt 155 lb 3.3 oz (70.4 kg)  BMI 22.91  kg/m2  SpO2 93% Physical Exam  Constitutional: He appears well-developed and well-nourished.  The patient's color is normal he shows no pallor. He is not diaphoretic. He is not in acute respiratory distress. He however is very limited in responsiveness.  HENT:  Head: Normocephalic and atraumatic.  Right Ear: External ear normal.  Left Ear: External ear normal.  Nose: Nose normal.  Mouth/Throat: Oropharynx is clear and moist.  The patient has generally poor dentition. There is no localizing facial swelling or palpable firmness. Oral cavity itself is widely patent without any exudate or  swelling of the peri-tonsillar or uvular region. Gag is intact.  Eyes: Right eye exhibits no discharge. Left eye exhibits no discharge.  The patient has had cataract repair on the right and likely on the left as well. There is some pupillary asymmetry but this appears to be surgical in nature. The patient will track a bit with his eyes. He is not directly following commands for pupillary motions. I cannot appreciate any distinctly disconjugate movements. There is mild diffuse scleral injection on the left. No drainage no discharge and no periorbital edema.  Neck: Neck supple. No tracheal deviation present.  Cardiovascular:  Tachycardia. No gross rub murmur or gallop.  Pulmonary/Chest: Effort normal. No stridor.  Auscultation is somewhat difficult. The patient is not following commands for deep inspiration. Suspicious for diffuse rails however with limited inspiration cannot identify gross abnormality. No active wheezing. Breath sounds appear symmetric.  Abdominal: Soft. He exhibits no distension.  Genitourinary: Penis normal.  Musculoskeletal: He exhibits no edema or tenderness.  Neurological:  The patient is awake. He is not making any appropriate verbal response. There've been essentially no verbalization. He slightly follow commands for opening his mouth. He does not follow any commands for grip strength or extension movements. Upon trying to reposition and move the patient he becomes very stiff and generally resists movement. When I pick up and touch his feet he lifts them and withdraws them illustrating symmetric lower extremity dysfunction.  Skin: Skin is dry.    ED Course  Procedures (including critical care time) Labs Review Labs Reviewed  CBC WITH DIFFERENTIAL - Abnormal; Notable for the following:    WBC 23.1 (*)    RBC 6.88 (*)    Hemoglobin 20.9 (*)    HCT 58.0 (*)    Neutrophils Relative % 88 (*)    Neutro Abs 20.4 (*)    Lymphocytes Relative 4 (*)    Monocytes Absolute 1.8  (*)    All other components within normal limits  COMPREHENSIVE METABOLIC PANEL - Abnormal; Notable for the following:    Glucose, Bld 123 (*)    BUN 26 (*)    AST 64 (*)    ALT 92 (*)    GFR calc non Af Amer 84 (*)    Anion gap 16 (*)    All other components within normal limits  URINALYSIS, ROUTINE W REFLEX MICROSCOPIC - Abnormal; Notable for the following:    Glucose, UA 250 (*)    Hgb urine dipstick MODERATE (*)    Protein, ur >300 (*)    All other components within normal limits  PRO B NATRIURETIC PEPTIDE - Abnormal; Notable for the following:    Pro B Natriuretic peptide (BNP) 283.9 (*)    All other components within normal limits  SALICYLATE LEVEL - Abnormal; Notable for the following:    Salicylate Lvl <6.3 (*)    All other components within normal limits  AMMONIA - Abnormal; Notable  for the following:    Ammonia 79 (*)    All other components within normal limits  HEMOGLOBIN A1C - Abnormal; Notable for the following:    Hgb A1c MFr Bld 10.1 (*)    Mean Plasma Glucose 243 (*)    All other components within normal limits  CBC WITH DIFFERENTIAL - Abnormal; Notable for the following:    WBC 15.0 (*)    RBC 6.09 (*)    Hemoglobin 18.1 (*)    Neutro Abs 11.4 (*)    Monocytes Absolute 1.2 (*)    All other components within normal limits  COMPREHENSIVE METABOLIC PANEL - Abnormal; Notable for the following:    Sodium 136 (*)    Calcium 8.3 (*)    Total Protein 5.9 (*)    Albumin 2.6 (*)    GFR calc non Af Amer 87 (*)    All other components within normal limits  GLUCOSE, CAPILLARY - Abnormal; Notable for the following:    Glucose-Capillary 55 (*)    All other components within normal limits  GLUCOSE, CAPILLARY - Abnormal; Notable for the following:    Glucose-Capillary 134 (*)    All other components within normal limits  GLUCOSE, CAPILLARY - Abnormal; Notable for the following:    Glucose-Capillary 104 (*)    All other components within normal limits  GLUCOSE,  CAPILLARY - Abnormal; Notable for the following:    Glucose-Capillary 57 (*)    All other components within normal limits  GLUCOSE, CAPILLARY - Abnormal; Notable for the following:    Glucose-Capillary 177 (*)    All other components within normal limits  GLUCOSE, CAPILLARY - Abnormal; Notable for the following:    Glucose-Capillary 113 (*)    All other components within normal limits  GLUCOSE, CAPILLARY - Abnormal; Notable for the following:    Glucose-Capillary 133 (*)    All other components within normal limits  GLUCOSE, CAPILLARY - Abnormal; Notable for the following:    Glucose-Capillary 159 (*)    All other components within normal limits  GLUCOSE, CAPILLARY - Abnormal; Notable for the following:    Glucose-Capillary 233 (*)    All other components within normal limits  GLUCOSE, CAPILLARY - Abnormal; Notable for the following:    Glucose-Capillary 269 (*)    All other components within normal limits  GLUCOSE, CAPILLARY - Abnormal; Notable for the following:    Glucose-Capillary 177 (*)    All other components within normal limits  GLUCOSE, CAPILLARY - Abnormal; Notable for the following:    Glucose-Capillary 143 (*)    All other components within normal limits  I-STAT CG4 LACTIC ACID, ED - Abnormal; Notable for the following:    Lactic Acid, Venous 2.88 (*)    All other components within normal limits  I-STAT ARTERIAL BLOOD GAS, ED - Abnormal; Notable for the following:    pH, Arterial 7.327 (*)    pCO2 arterial 33.8 (*)    pO2, Arterial 58.0 (*)    Bicarbonate 18.0 (*)    Acid-base deficit 7.0 (*)    All other components within normal limits  CBG MONITORING, ED - Abnormal; Notable for the following:    Glucose-Capillary 131 (*)    All other components within normal limits  I-STAT CG4 LACTIC ACID, ED - Abnormal; Notable for the following:    Lactic Acid, Venous 2.90 (*)    All other components within normal limits  I-STAT CG4 LACTIC ACID, ED - Abnormal; Notable for  the following:  Lactic Acid, Venous 2.73 (*)    All other components within normal limits  CULTURE, BLOOD (ROUTINE X 2)  CULTURE, BLOOD (ROUTINE X 2)  URINE CULTURE  MRSA PCR SCREENING  LIPASE, BLOOD  ACETAMINOPHEN LEVEL  ETHANOL  URINE RAPID DRUG SCREEN (HOSP PERFORMED)  URINE MICROSCOPIC-ADD ON  TSH  CORTISOL  TROPONIN I  STREP PNEUMONIAE URINARY ANTIGEN  LACTIC ACID, PLASMA  GLUCOSE, CAPILLARY  GLUCOSE, CAPILLARY  GLUCOSE, CAPILLARY  GLUCOSE, CAPILLARY  LEGIONELLA ANTIGEN, URINE    Imaging Review Ct Head Wo Contrast  04/24/2014   CLINICAL DATA:  73 year old diabetic male found unresponsive this morning. Initial encounter.  EXAM: CT HEAD WITHOUT CONTRAST  TECHNIQUE: Contiguous axial images were obtained from the base of the skull through the vertex without intravenous contrast.  COMPARISON:  11/15/2013.  FINDINGS: No intracranial hemorrhage.  Small vessel disease type changes.  Left carotid terminus and M1 segment (bilaterally) of the middle cerebral artery appear dense and without change. This limits evaluation for detection of large acute infarct. No other CT evidence to suggest large acute infarct. If infarct remains of high clinical concern, MR imaging may be considered.  Mild atrophy. Mild ventricular prominence felt to be related to atrophy rather than hydrocephalus and without change.  No intracranial mass lesion noted on this unenhanced exam.  Vascular calcifications.  Opacification left sphenoid sinus.  Prior fracture right maxilla/zygomatic arch in an inferior right orbital floor.  IMPRESSION: No intracranial hemorrhage.  Small vessel disease type changes.  No CT evidence of large acute infarct. If infarct is of high clinical concern, MR imaging may be considered.  Opacification left sphenoid sinus.   Electronically Signed   By: Chauncey Cruel M.D.   On: 04/24/2014 09:07   Mr Brain Wo Contrast  04/24/2014   CLINICAL DATA:  Acute encephalopathy. Found unresponsive. Possible  diabetic related hypoglycemia. Also history of melanoma.  EXAM: MRI HEAD WITHOUT CONTRAST  TECHNIQUE: Multiplanar, multiecho pulse sequences of the brain and surrounding structures were obtained without intravenous contrast.  COMPARISON:  CT head earlier today.  FINDINGS: No evidence for acute infarction, hemorrhage, mass lesion, hydrocephalus, or extra-axial fluid. Mild to moderate cerebral and cerebellar atrophy, with ventricular prominence felt represent brain substance loss rather than hydrocephalus. Mild to moderate subcortical and periventricular T2 and FLAIR hyperintensities, likely chronic microvascular ischemic change.  Flow voids are maintained throughout the carotid, basilar, and vertebral arteries. Normal LEFT ICA terminus. There are no areas of chronic hemorrhage. Specifically, no abnormality on gradient sequence which might suggest melanotic lesion spread to the brain.  Pituitary, pineal, and cerebellar tonsils unremarkable. No upper cervical lesions. Visualized calvarium, skull base, and upper cervical osseous structures unremarkable. Scalp and extracranial soft tissues, orbits, sinuses, and mastoids show no acute process.  IMPRESSION: Atrophy and small vessel disease.  No evidence for acute stroke.  No areas concerning for melanoma spread to the brain on this noncontrast exam.   Electronically Signed   By: Rolla Flatten M.D.   On: 04/24/2014 16:01   US Abdomen Complete  04/25/2014   CLINICAL DATA:  Elevated serum ammonia levels. Evaluate for possible hepatic cirrhosis. Current history of diabetes.  EXAM: ULTRASOUND ABDOMEN COMPLETE  COMPARISON:  None.  FINDINGS: Gallbladder: Small amount of echogenic sludge. No shadowing gallstones. No gallbladder wall thickening or pericholecystic fluid. Negative sonographic Murphy sign according to the ultrasound technologist.  Common bile duct: Diameter: Approximately 2 mm.  Liver: Normal size and echotexture without focal parenchymal abnormality. Patent portal  vein with hepatopetal  flow.  IVC: Patent.  Pancreas: Normal-appearing head and proximal body. Distal body and tail obscured by overlying bowel gas.  Spleen: Not visualized due to abundant bowel gas in the left side of the abdomen.  Right Kidney: Length: Approximately 10.4 cm. No hydronephrosis. Well-preserved cortex. No shadowing calculi. Normal parenchymal echotexture. No focal parenchymal abnormality.  Left Kidney: Not visualized due to abundant bowel gas in the left side of the abdomen.  Abdominal aorta: Normal in caliber throughout its visualized course in the abdomen with evidence of atherosclerosis. Maximum diameter 2.4 cm.  Other findings: None.  IMPRESSION: 1. No evidence of hepatic cirrhosis as the liver is normal in size and appearance by ultrasound. 2. Small amount of gallbladder sludge. No evidence of cholelithiasis or cholecystitis. 3. Obscuration of the distal body and tail of the pancreas, the spleen, and the left kidney due to abundant bowel gas.   Electronically Signed   By: Evangeline Dakin M.D.   On: 04/25/2014 12:32   Dg Chest Port 1 View  04/24/2014   CLINICAL DATA:  Confusion  EXAM: PORTABLE CHEST - 1 VIEW  COMPARISON:  None.  FINDINGS: Heart size upper normal.  Negative for heart failure.  Mild bibasilar airspace disease may represent pneumonia or atelectasis. No effusion. Hypoventilation with decreased lung volume.  IMPRESSION: Hypoventilation with bibasilar atelectasis/ infiltrate.   Electronically Signed   By: Franchot Gallo M.D.   On: 04/24/2014 08:25     EKG Interpretation   Date/Time:  Tuesday April 24 2014 11:30:11 EST Ventricular Rate:  97 PR Interval:  166 QRS Duration: 95 QT Interval:  352 QTC Calculation: 447 R Axis:   178 Text Interpretation:  Sinus rhythm Probable left atrial enlargement Right  axis deviation Probable anteroseptal infarct, old no STEMI. no acute  ischemic appearance. no old comparison. Confirmed by Johnney Killian, MD, Jeannie Done  [phone]) on 04/24/2014  11:39:49 AM     CRITICAL CARE Performed by: Charlesetta Shanks   Total critical care time: 45  Critical care time was exclusive of separately billable procedures and treating other patients.  Critical care was necessary to treat or prevent imminent or life-threatening deterioration.  Critical care was time spent personally by me on the following activities: development of treatment plan with patient and/or surrogate as well as nursing, discussions with consultants, evaluation of patient's response to treatment, examination of patient, obtaining history from patient or surrogate, ordering and performing treatments and interventions, ordering and review of laboratory studies, ordering and review of radiographic studies, pulse oximetry and re-evaluation of patient's condition. MDM   Final diagnoses:  Community acquired pneumonia  Sepsis, due to unspecified organism   The patient presents with profound mental status change without clear etiology. At this point sepsis as a working diagnosis with abnormal chest x-ray and hypothermia. Patient was moderately hypoglycemic in the field at 50 mg/dL. There is been no recurrence of hypoglycemia. CT head does not show an acute etiology.    Charlesetta Shanks, MD 04/26/14 [phone]

## 2014-04-24 NOTE — ED Notes (Signed)
CBG 130 

## 2014-04-24 NOTE — ED Notes (Signed)
Contacted CT regarding completing CT HEAD

## 2014-04-24 NOTE — Progress Notes (Signed)
EEG completed, results pending. 

## 2014-04-24 NOTE — Progress Notes (Signed)
Called back to get report x2. Will try again.

## 2014-04-25 ENCOUNTER — Inpatient Hospital Stay (HOSPITAL_COMMUNITY): Payer: Medicare HMO

## 2014-04-25 DIAGNOSIS — E7220 Disorder of urea cycle metabolism, unspecified: Secondary | ICD-10-CM

## 2014-04-25 LAB — COMPREHENSIVE METABOLIC PANEL
ALBUMIN: 2.6 g/dL — AB (ref 3.5–5.2)
ALK PHOS: 91 U/L (ref 39–117)
ALT: 53 U/L (ref 0–53)
AST: 27 U/L (ref 0–37)
Anion gap: 13 (ref 5–15)
BILIRUBIN TOTAL: 0.9 mg/dL (ref 0.3–1.2)
BUN: 12 mg/dL (ref 6–23)
CHLORIDE: 102 meq/L (ref 96–112)
CO2: 21 mEq/L (ref 19–32)
Calcium: 8.3 mg/dL — ABNORMAL LOW (ref 8.4–10.5)
Creatinine, Ser: 0.82 mg/dL (ref 0.50–1.35)
GFR calc Af Amer: >90 mL/min (ref >90)
GFR calc non Af Amer: 87 mL/min — ABNORMAL LOW (ref >90)
Glucose, Bld: 82 mg/dL (ref 70–99)
Potassium: 3.8 mEq/L (ref 3.7–5.3)
SODIUM: 136 meq/L — AB (ref 137–147)
TOTAL PROTEIN: 5.9 g/dL — AB (ref 6.0–8.3)

## 2014-04-25 LAB — GLUCOSE, CAPILLARY
GLUCOSE-CAPILLARY: 113 mg/dL — AB (ref 70–99)
GLUCOSE-CAPILLARY: 269 mg/dL — AB (ref 70–99)
GLUCOSE-CAPILLARY: 177 mg/dL — AB (ref 70–99)
Glucose-Capillary: 133 mg/dL — ABNORMAL HIGH (ref 70–99)
Glucose-Capillary: 143 mg/dL — ABNORMAL HIGH (ref 70–99)
Glucose-Capillary: 159 mg/dL — ABNORMAL HIGH (ref 70–99)
Glucose-Capillary: 233 mg/dL — ABNORMAL HIGH (ref 70–99)
Glucose-Capillary: 73 mg/dL (ref 70–99)
Glucose-Capillary: 74 mg/dL (ref 70–99)

## 2014-04-25 LAB — CBC WITH DIFFERENTIAL/PLATELET
BASOS PCT: 0 % (ref 0–1)
Basophils Absolute: 0 10*3/uL (ref 0.0–0.1)
EOS ABS: 0.2 10*3/uL (ref 0.0–0.7)
Eosinophils Relative: 1 % (ref 0–5)
HCT: 50.8 % (ref 39.0–52.0)
Hemoglobin: 18.1 g/dL — ABNORMAL HIGH (ref 13.0–17.0)
Lymphocytes Relative: 15 % (ref 12–46)
Lymphs Abs: 2.2 10*3/uL (ref 0.7–4.0)
MCH: 29.7 pg (ref 26.0–34.0)
MCHC: 35.6 g/dL (ref 30.0–36.0)
MCV: 83.4 fL (ref 78.0–100.0)
Monocytes Absolute: 1.2 10*3/uL — ABNORMAL HIGH (ref 0.1–1.0)
Monocytes Relative: 8 % (ref 3–12)
NEUTROS ABS: 11.4 10*3/uL — AB (ref 1.7–7.7)
Neutrophils Relative %: 76 % (ref 43–77)
PLATELETS: 159 10*3/uL (ref 150–400)
RBC: 6.09 MIL/uL — AB (ref 4.22–5.81)
RDW: 14 % (ref 11.5–15.5)
WBC: 15 10*3/uL — AB (ref 4.0–10.5)

## 2014-04-25 LAB — CORTISOL: CORTISOL PLASMA: 33.4 ug/dL

## 2014-04-25 LAB — URINE CULTURE
Colony Count: NO GROWTH
Culture: NO GROWTH

## 2014-04-25 LAB — LACTIC ACID, PLASMA: Lactic Acid, Venous: 1.5 mmol/L (ref 0.5–2.2)

## 2014-04-25 MED ORDER — LACTULOSE 10 GM/15ML PO SOLN
30.0000 g | Freq: Every day | ORAL | Status: AC
  Administered 2014-04-25 – 2014-04-27 (×3): 30 g via ORAL
  Filled 2014-04-25 (×3): qty 45

## 2014-04-25 MED ORDER — PREDNISOLONE ACETATE 1 % OP SUSP
1.0000 [drp] | Freq: Four times a day (QID) | OPHTHALMIC | Status: DC
  Administered 2014-04-25 – 2014-04-30 (×18): 1 [drp] via OPHTHALMIC
  Filled 2014-04-25 (×2): qty 1

## 2014-04-25 MED ORDER — DEXTROSE 10 % IV SOLN
INTRAVENOUS | Status: DC
  Administered 2014-04-25 (×2): via INTRAVENOUS

## 2014-04-25 MED ORDER — POTASSIUM CHLORIDE IN NACL 20-0.9 MEQ/L-% IV SOLN
INTRAVENOUS | Status: DC
  Administered 2014-04-25 – 2014-04-26 (×2): via INTRAVENOUS
  Filled 2014-04-25 (×4): qty 1000

## 2014-04-25 NOTE — Progress Notes (Addendum)
[facility] PROGRESS NOTE  Stephen Frost FXT:[id number] DOB: 05-29-1940 DOA: 04/24/2014 PCP: Antony Blackbird, MD Summary: 73 year old white male diabetic who was found unresponsive by his ex-wife. He was found to be hypoglycemic, hypothermic. He had a lactic acidosis, possible pneumonia with leukocytosis. His mental status did not improve with fluid bolus, rewarming and D50.   Assessment/Plan:  Principal Problem:   Encephalopathy acute:  EEG shows generalized slowing without epileptiform activity. MRI shows nothing acute. Patient is starting to wake up but remains impulsive and disoriented. Suspect encephalopathy from prolonged hypoglycemia, possibly hypoglycemic seizure, possibly sepsis contributing. Also, patient's ammonia level is elevated at above 70. No history of liver disease or alcohol abuse. Will give a few doses of lactulose and check an ultrasound of the abdomen to evaluate further. Patient's wife has also brought in a bottle of gabapentin 800 mg 3 times a day. Discussed with Dr. Chapman Fitch, patient's primary care provider. Her records show that patient had been on 300 mg nightly. If this is a new prescription, may have contributed. Continue step down monitoring. Suspect patient will continue to improve.   Active Problems:   Hypoglycemia:  Resolving. We will start clear liquid diet and advance as tolerated. Once blood glucoses start coming up, will transition to non-dextrose containing IV fluid. Hold off on insulin unless blood glucoses start coming up on saline infusion.     probable Aspiration pneumonia with sepsis:  Continue Unasyn and azithromycin for now. Urine strep pneumonia antigen negative.    Hypothermia:  Resolved    DM type 2 (diabetes mellitus, type 2):  Insulin held.  Hyperlipidemia: Patient also on statin which has been held.   Code Status:  full Family Communication:  Wife and daughter at bedside Disposition Plan:    Consultants:    Procedures:      Antibiotics:  Unasyn, azithromycin 12/9--  HPI/Subjective: No complaints. Per nursing staff, required a small dose of Ativan overnight for agitation, as well as physical restraints. He was trying to pull at his tubes and wires.  Objective: Filed Vitals:   04/25/14 0318  BP: 125/72  Pulse: 72  Temp: 97.5 F (36.4 C)  Resp: 16    Intake/Output Summary (Last 24 hours) at 04/25/14 0902 Last data filed at 04/25/14 0550  Gross per 24 hour  Intake 1241.66 ml  Output   1100 ml  Net 141.66 ml   Filed Weights   04/24/14 1200  Weight: 70.4 kg (155 lb 3.3 oz)    Exam:   General:  Arousable. Will answer questions. Disoriented to time and place. Recognizes his ex-wife, but not his daughter. Distracted. Redirectable. Occasionally agitated  Cardiovascular: Regular rate rhythm without murmurs gallops rubs  Respiratory: Clear to auscultation bilaterally without wheezes rhonchi or rales  Abdomen: Soft nontender nondistended. Bowel sounds present.  Ext: No clubbing cyanosis or edema  Hyperammonemia  Basic Metabolic Panel:  Recent Labs Lab 04/24/14 0830 04/25/14 0240  NA 140 136*  K 4.2 3.8  CL 101 102  CO2 23 21  GLUCOSE 123* 82  BUN 26* 12  CREATININE 0.89 0.82  CALCIUM 8.8 8.3*   Liver Function Tests:  Recent Labs Lab 04/24/14 0830 04/25/14 0240  AST 64* 27  ALT 92* 53  ALKPHOS 115 91  BILITOT 0.7 0.9  PROT 7.2 5.9*  ALBUMIN 3.5 2.6*    Recent Labs Lab 04/24/14 0830  LIPASE 20    Recent Labs Lab 04/24/14 1112  AMMONIA 79*   CBC:  Recent Labs Lab 04/24/14 0830  04/25/14 0240  WBC 23.1* 15.0*  NEUTROABS 20.4* 11.4*  HGB 20.9* 18.1*  HCT 58.0* 50.8  MCV 84.3 83.4  PLT 189 159   Cardiac Enzymes:  Recent Labs Lab 04/24/14 1115  TROPONINI <0.30   BNP (last 3 results)  Recent Labs  04/24/14 0830  PROBNP 283.9*   CBG:  Recent Labs Lab 04/25/14 0018 04/25/14 0152 04/25/14 0347 04/25/14 0544 04/25/14 0748  GLUCAP 74 73  113* 133* 159*    Recent Results (from the past 240 hour(s))  Blood Culture (routine x 2)     Status: None (Preliminary result)   Collection Time: 04/24/14  8:30 AM  Result Value Ref Range Status   Specimen Description BLOOD LEFT ARM  Final   Special Requests BOTTLES DRAWN AEROBIC AND ANAEROBIC 5CC  Final   Culture  Setup Time   Final    04/24/2014 14:40 Performed at [facility]    Culture   Final           BLOOD CULTURE RECEIVED NO GROWTH TO DATE CULTURE WILL BE HELD FOR 5 DAYS BEFORE ISSUING A FINAL NEGATIVE REPORT Performed at [facility]    Report Status PENDING  Incomplete  Blood Culture (routine x 2)     Status: None (Preliminary result)   Collection Time: 04/24/14  8:40 AM  Result Value Ref Range Status   Specimen Description BLOOD RIGHT ANTECUBITAL  Final   Special Requests BOTTLES DRAWN AEROBIC AND ANAEROBIC 5CC  Final   Culture  Setup Time   Final    04/24/2014 14:39 Performed at [facility]    Culture   Final           BLOOD CULTURE RECEIVED NO GROWTH TO DATE CULTURE WILL BE HELD FOR 5 DAYS BEFORE ISSUING A FINAL NEGATIVE REPORT Performed at [facility]    Report Status PENDING  Incomplete  MRSA PCR Screening     Status: None   Collection Time: 04/24/14 12:42 PM  Result Value Ref Range Status   MRSA by PCR NEGATIVE NEGATIVE Final    Comment:        The GeneXpert MRSA Assay (FDA approved for NASAL specimens only), is one component of a comprehensive MRSA colonization surveillance program. It is not intended to diagnose MRSA infection nor to guide or monitor treatment for MRSA infections.      Studies: Ct Head Wo Contrast  04/24/2014   CLINICAL DATA:  73 year old diabetic male found unresponsive this morning. Initial encounter.  EXAM: CT HEAD WITHOUT CONTRAST  TECHNIQUE: Contiguous axial images were obtained from the base of the skull through the vertex without intravenous contrast.  COMPARISON:  11/15/2013.  FINDINGS: No  intracranial hemorrhage.  Small vessel disease type changes.  Left carotid terminus and M1 segment (bilaterally) of the middle cerebral artery appear dense and without change. This limits evaluation for detection of large acute infarct. No other CT evidence to suggest large acute infarct. If infarct remains of high clinical concern, MR imaging may be considered.  Mild atrophy. Mild ventricular prominence felt to be related to atrophy rather than hydrocephalus and without change.  No intracranial mass lesion noted on this unenhanced exam.  Vascular calcifications.  Opacification left sphenoid sinus.  Prior fracture right maxilla/zygomatic arch in an inferior right orbital floor.  IMPRESSION: No intracranial hemorrhage.  Small vessel disease type changes.  No CT evidence of large acute infarct. If infarct is of high clinical concern, MR imaging may be considered.  Opacification  left sphenoid sinus.   Electronically Signed   By: Chauncey Cruel M.D.   On: 04/24/2014 09:07   Mr Brain Wo Contrast  04/24/2014   CLINICAL DATA:  Acute encephalopathy. Found unresponsive. Possible diabetic related hypoglycemia. Also history of melanoma.  EXAM: MRI HEAD WITHOUT CONTRAST  TECHNIQUE: Multiplanar, multiecho pulse sequences of the brain and surrounding structures were obtained without intravenous contrast.  COMPARISON:  CT head earlier today.  FINDINGS: No evidence for acute infarction, hemorrhage, mass lesion, hydrocephalus, or extra-axial fluid. Mild to moderate cerebral and cerebellar atrophy, with ventricular prominence felt represent brain substance loss rather than hydrocephalus. Mild to moderate subcortical and periventricular T2 and FLAIR hyperintensities, likely chronic microvascular ischemic change.  Flow voids are maintained throughout the carotid, basilar, and vertebral arteries. Normal LEFT ICA terminus. There are no areas of chronic hemorrhage. Specifically, no abnormality on gradient sequence which might suggest  melanotic lesion spread to the brain.  Pituitary, pineal, and cerebellar tonsils unremarkable. No upper cervical lesions. Visualized calvarium, skull base, and upper cervical osseous structures unremarkable. Scalp and extracranial soft tissues, orbits, sinuses, and mastoids show no acute process.  IMPRESSION: Atrophy and small vessel disease.  No evidence for acute stroke.  No areas concerning for melanoma spread to the brain on this noncontrast exam.   Electronically Signed   By: Rolla Flatten M.D.   On: 04/24/2014 16:01   Dg Chest Port 1 View  04/24/2014   CLINICAL DATA:  Confusion  EXAM: PORTABLE CHEST - 1 VIEW  COMPARISON:  None.  FINDINGS: Heart size upper normal.  Negative for heart failure.  Mild bibasilar airspace disease may represent pneumonia or atelectasis. No effusion. Hypoventilation with decreased lung volume.  IMPRESSION: Hypoventilation with bibasilar atelectasis/ infiltrate.   Electronically Signed   By: Franchot Gallo M.D.   On: 04/24/2014 08:25    Scheduled Meds: . ampicillin-sulbactam (UNASYN) IV  3 g Intravenous Q6H  . antiseptic oral rinse  7 mL Mouth Rinse q12n4p  . azithromycin  500 mg Intravenous Q24H  . chlorhexidine  15 mL Mouth Rinse BID  . enoxaparin (LOVENOX) injection  40 mg Subcutaneous Q24H  . haloperidol lactate  2 mg Intravenous Once   Continuous Infusions: . dextrose 100 mL/hr at 04/25/14 0230    Time spent: 35 minutes  [facility]  [email or web address], password [facility] 04/25/2014, 9:02 AM  LOS: 1 day

## 2014-04-25 NOTE — Progress Notes (Signed)
Inpatient Diabetes Program Recommendations  AACE/ADA: New Consensus Statement on Inpatient Glycemic Control (2013)  Target Ranges:  Prepandial:   less than 140 mg/dL      Peak postprandial:   less than 180 mg/dL (1-2 hours)      Critically ill patients:  140 - 180 mg/dL   Reason for Assessment:  Results for Stephen Frost, Stephen Frost (MRN [id number]) as of 04/25/2014 15:02  Ref. Range 04/25/2014 01:52 04/25/2014 03:47 04/25/2014 05:44 04/25/2014 07:48 04/25/2014 11:40  Glucose-Capillary Latest Range: 70-99 mg/dL 73 113 (H) 133 (H) 159 (H) 233 (H)   Note that pt. Admitted with hypoglycemia.  According to medication reconciliation, he was taking Lantus 50 units bid prior to admit.  Currently on no insulin.  CBG's trending up, may consider sensitive Novolog correction.    Will follow. Adah Perl, RN, BC-ADM Inpatient Diabetes Coordinator Pager [phone]

## 2014-04-26 DIAGNOSIS — E11649 Type 2 diabetes mellitus with hypoglycemia without coma: Secondary | ICD-10-CM

## 2014-04-26 LAB — GLUCOSE, CAPILLARY
GLUCOSE-CAPILLARY: 131 mg/dL — AB (ref 70–99)
Glucose-Capillary: 119 mg/dL — ABNORMAL HIGH (ref 70–99)
Glucose-Capillary: 105 mg/dL — ABNORMAL HIGH (ref 70–99)
Glucose-Capillary: 195 mg/dL — ABNORMAL HIGH (ref 70–99)
Glucose-Capillary: 226 mg/dL — ABNORMAL HIGH (ref 70–99)

## 2014-04-26 LAB — LEGIONELLA ANTIGEN, URINE

## 2014-04-26 MED ORDER — INFLUENZA VAC SPLIT QUAD 0.5 ML IM SUSY
0.5000 mL | PREFILLED_SYRINGE | INTRAMUSCULAR | Status: AC
  Administered 2014-04-27: 0.5 mL via INTRAMUSCULAR
  Filled 2014-04-26: qty 0.5

## 2014-04-26 MED ORDER — AZITHROMYCIN 500 MG PO TABS
500.0000 mg | ORAL_TABLET | Freq: Every day | ORAL | Status: DC
  Administered 2014-04-26 – 2014-04-28 (×3): 500 mg via ORAL
  Filled 2014-04-26 (×4): qty 1

## 2014-04-26 MED ORDER — INSULIN ASPART 100 UNIT/ML ~~LOC~~ SOLN
0.0000 [IU] | Freq: Three times a day (TID) | SUBCUTANEOUS | Status: DC
  Administered 2014-04-26: 2 [IU] via SUBCUTANEOUS
  Administered 2014-04-26: 1 [IU] via SUBCUTANEOUS
  Administered 2014-04-27: 2 [IU] via SUBCUTANEOUS
  Administered 2014-04-27 – 2014-04-28 (×4): 5 [IU] via SUBCUTANEOUS
  Administered 2014-04-28 – 2014-04-29 (×2): 3 [IU] via SUBCUTANEOUS
  Administered 2014-04-29: 2 [IU] via SUBCUTANEOUS
  Administered 2014-04-29 – 2014-04-30 (×2): 3 [IU] via SUBCUTANEOUS
  Administered 2014-04-30: 2 [IU] via SUBCUTANEOUS

## 2014-04-26 MED ORDER — INSULIN ASPART 100 UNIT/ML ~~LOC~~ SOLN
0.0000 [IU] | Freq: Every day | SUBCUTANEOUS | Status: DC
  Administered 2014-04-26 – 2014-04-28 (×3): 2 [IU] via SUBCUTANEOUS
  Administered 2014-04-29: 3 [IU] via SUBCUTANEOUS

## 2014-04-26 MED ORDER — PNEUMOCOCCAL VAC POLYVALENT 25 MCG/0.5ML IJ INJ
0.5000 mL | INJECTION | INTRAMUSCULAR | Status: AC
  Administered 2014-04-27: 0.5 mL via INTRAMUSCULAR
  Filled 2014-04-26: qty 0.5

## 2014-04-26 NOTE — Care Management Note (Signed)
    Page 1 of 1   04/26/2014     11:06:33 AM CARE MANAGEMENT NOTE 04/26/2014  Patient:  Stephen Frost, Stephen Frost   Account Number:  [id number]  Date Initiated:  04/26/2014  Documentation initiated by:  Marvetta Gibbons  Subjective/Objective Assessment:   Pt found down at home- admitted with acute encephalopathy     Action/Plan:   PTA pt lived at home- would benefit from PT/OT evals when able   Anticipated DC Date:  04/30/2014   Anticipated DC Plan:  [facility]  CM consult      Choice offered to / List presented to:             Status of service:  In process, will continue to follow Medicare Important Message given?   (If response is "NO", the following Medicare IM given date fields will be blank) Date Medicare IM given:   Medicare IM given by:   Date Additional Medicare IM given:   Additional Medicare IM given by:    Discharge Disposition:    Per UR Regulation:  Reviewed for med. necessity/level of care/duration of stay  If discussed at [place] of Stay Meetings, dates discussed:    Comments:

## 2014-04-26 NOTE — Progress Notes (Signed)
Nutrition Brief Note  Patient identified with a low Braden score of 12.   Wt Readings from Last 15 Encounters:  04/26/14 149 lb 11.1 oz (67.9 kg)    Body mass index is 22.1 kg/(m^2). Patient meets criteria for normal based on current BMI.   Current diet order is heart healthy, patient is consuming approximately 95-100% of meals at this time. Labs and medications reviewed. No skin breakdown.  No nutrition interventions warranted at this time. If nutrition issues arise, please consult RD.   Molli Barrows, RD, LDN, [place] Pager [phone] After Hours Pager [phone]

## 2014-04-26 NOTE — Progress Notes (Signed)
[facility] PROGRESS NOTE  Stephen Frost LFY:[id number] DOB: Apr 21, 1941 DOA: 04/24/2014 PCP: Antony Blackbird, MD Summary: 73 year old white male diabetic who was found unresponsive by his ex-wife. He was found to be hypoglycemic, hypothermic. He had a lactic acidosis, possible pneumonia with leukocytosis. His mental status did not improve with fluid bolus, rewarming and D50.   Assessment/Plan:    Encephalopathy acute:   -EEG shows generalized slowing without epileptiform activity.  -MRI shows nothing acute.  -? encephalopathy from prolonged hypoglycemia, possibly hypoglycemic seizure, possibly sepsis contributing. - ammonia level is elevated at above 70. No history of liver disease or alcohol abuse. gave a few doses of lactulose and check an ultrasound of the abdomen to evaluate further.  -Patient's wife has also brought in a bottle of gabapentin 800 mg 3 times a day. Was discussed with Dr. Chapman Fitch, patient's primary care provider. Her records show that patient had been on 300 mg nightly. If this is a new prescription, may have contributed. Continue step down monitoring. Suspect patient will continue to improve.     Hypoglycemia:  Resolving.  SSI diet     probable Aspiration pneumonia with sepsis:  Continue Unasyn and azithromycin for now. Urine strep pneumonia antigen negative.    Hypothermia:  Resolved    DM type 2 (diabetes mellitus, type 2):  lantus held  Hyperlipidemia: Patient also on statin which has been held.   Code Status:  full Family Communication:  Wife and daughter at bedside Disposition Plan:    Consultants:    Procedures:     Antibiotics:  Unasyn, azithromycin 12/9--  HPI/Subjective: More awake and appropriate   Objective: Filed Vitals:   04/26/14 0300  BP: 159/83  Pulse: 88  Temp: 98.4 F (36.9 C)  Resp: 20    Intake/Output Summary (Last 24 hours) at 04/26/14 8527 Last data filed at 04/26/14 0400  Gross per 24 hour  Intake   2340 ml   Output   2150 ml  Net    190 ml   Filed Weights   04/24/14 1200 04/26/14 0300  Weight: 70.4 kg (155 lb 3.3 oz) 67.9 kg (149 lb 11.1 oz)    Exam:   General:  Awake- oriented to person, place and time   Cardiovascular: Regular rate rhythm without murmurs gallops rubs  Respiratory: Clear to auscultation bilaterally without wheezes rhonchi or rales  Abdomen: Soft nontender nondistended. Bowel sounds present.  Ext: No clubbing cyanosis or edema   Basic Metabolic Panel:  Recent Labs Lab 04/24/14 0830 04/25/14 0240  NA 140 136*  K 4.2 3.8  CL 101 102  CO2 23 21  GLUCOSE 123* 82  BUN 26* 12  CREATININE 0.89 0.82  CALCIUM 8.8 8.3*   Liver Function Tests:  Recent Labs Lab 04/24/14 0830 04/25/14 0240  AST 64* 27  ALT 92* 53  ALKPHOS 115 91  BILITOT 0.7 0.9  PROT 7.2 5.9*  ALBUMIN 3.5 2.6*    Recent Labs Lab 04/24/14 0830  LIPASE 20    Recent Labs Lab 04/24/14 1112  AMMONIA 79*   CBC:  Recent Labs Lab 04/24/14 0830 04/25/14 0240  WBC 23.1* 15.0*  NEUTROABS 20.4* 11.4*  HGB 20.9* 18.1*  HCT 58.0* 50.8  MCV 84.3 83.4  PLT 189 159   Cardiac Enzymes:  Recent Labs Lab 04/24/14 1115  TROPONINI <0.30   BNP (last 3 results)  Recent Labs  04/24/14 0830  PROBNP 283.9*   CBG:  Recent Labs Lab 04/25/14 1140 04/25/14 1539 04/25/14 1927 04/25/14 2308  04/26/14 0346  GLUCAP 233* 269* 177* 143* 119*    Recent Results (from the past 240 hour(s))  Blood Culture (routine x 2)     Status: None (Preliminary result)   Collection Time: 04/24/14  8:30 AM  Result Value Ref Range Status   Specimen Description BLOOD LEFT ARM  Final   Special Requests BOTTLES DRAWN AEROBIC AND ANAEROBIC 5CC  Final   Culture  Setup Time   Final    04/24/2014 14:40 Performed at [facility]    Culture   Final           BLOOD CULTURE RECEIVED NO GROWTH TO DATE CULTURE WILL BE HELD FOR 5 DAYS BEFORE ISSUING A FINAL NEGATIVE REPORT Performed at [facility]    Report Status PENDING  Incomplete  Urine culture     Status: None   Collection Time: 04/24/14  8:30 AM  Result Value Ref Range Status   Specimen Description URINE, CATHETERIZED  Final   Special Requests NONE  Final   Culture  Setup Time   Final    04/24/2014 14:45 Performed at [place] Performed at [facility]   Final   Culture NO GROWTH Performed at [facility]   Final   Report Status 04/25/2014 FINAL  Final  Blood Culture (routine x 2)     Status: None (Preliminary result)   Collection Time: 04/24/14  8:40 AM  Result Value Ref Range Status   Specimen Description BLOOD RIGHT ANTECUBITAL  Final   Special Requests BOTTLES DRAWN AEROBIC AND ANAEROBIC 5CC  Final   Culture  Setup Time   Final    04/24/2014 14:39 Performed at [facility]    Culture   Final           BLOOD CULTURE RECEIVED NO GROWTH TO DATE CULTURE WILL BE HELD FOR 5 DAYS BEFORE ISSUING A FINAL NEGATIVE REPORT Performed at [facility]    Report Status PENDING  Incomplete  MRSA PCR Screening     Status: None   Collection Time: 04/24/14 12:42 PM  Result Value Ref Range Status   MRSA by PCR NEGATIVE NEGATIVE Final    Comment:        The GeneXpert MRSA Assay (FDA approved for NASAL specimens only), is one component of a comprehensive MRSA colonization surveillance program. It is not intended to diagnose MRSA infection nor to guide or monitor treatment for MRSA infections.      Studies: Ct Head Wo Contrast  04/24/2014   CLINICAL DATA:  73 year old diabetic male found unresponsive this morning. Initial encounter.  EXAM: CT HEAD WITHOUT CONTRAST  TECHNIQUE: Contiguous axial images were obtained from the base of the skull through the vertex without intravenous contrast.  COMPARISON:  11/15/2013.  FINDINGS: No intracranial hemorrhage.  Small vessel disease type changes.  Left carotid terminus and M1 segment (bilaterally) of the  middle cerebral artery appear dense and without change. This limits evaluation for detection of large acute infarct. No other CT evidence to suggest large acute infarct. If infarct remains of high clinical concern, MR imaging may be considered.  Mild atrophy. Mild ventricular prominence felt to be related to atrophy rather than hydrocephalus and without change.  No intracranial mass lesion noted on this unenhanced exam.  Vascular calcifications.  Opacification left sphenoid sinus.  Prior fracture right maxilla/zygomatic arch in an inferior right orbital floor.  IMPRESSION: No intracranial hemorrhage.  Small vessel disease  type changes.  No CT evidence of large acute infarct. If infarct is of high clinical concern, MR imaging may be considered.  Opacification left sphenoid sinus.   Electronically Signed   By: Chauncey Cruel M.D.   On: 04/24/2014 09:07   Mr Brain Wo Contrast  04/24/2014   CLINICAL DATA:  Acute encephalopathy. Found unresponsive. Possible diabetic related hypoglycemia. Also history of melanoma.  EXAM: MRI HEAD WITHOUT CONTRAST  TECHNIQUE: Multiplanar, multiecho pulse sequences of the brain and surrounding structures were obtained without intravenous contrast.  COMPARISON:  CT head earlier today.  FINDINGS: No evidence for acute infarction, hemorrhage, mass lesion, hydrocephalus, or extra-axial fluid. Mild to moderate cerebral and cerebellar atrophy, with ventricular prominence felt represent brain substance loss rather than hydrocephalus. Mild to moderate subcortical and periventricular T2 and FLAIR hyperintensities, likely chronic microvascular ischemic change.  Flow voids are maintained throughout the carotid, basilar, and vertebral arteries. Normal LEFT ICA terminus. There are no areas of chronic hemorrhage. Specifically, no abnormality on gradient sequence which might suggest melanotic lesion spread to the brain.  Pituitary, pineal, and cerebellar tonsils unremarkable. No upper cervical lesions.  Visualized calvarium, skull base, and upper cervical osseous structures unremarkable. Scalp and extracranial soft tissues, orbits, sinuses, and mastoids show no acute process.  IMPRESSION: Atrophy and small vessel disease.  No evidence for acute stroke.  No areas concerning for melanoma spread to the brain on this noncontrast exam.   Electronically Signed   By: Rolla Flatten M.D.   On: 04/24/2014 16:01   US Abdomen Complete  04/25/2014   CLINICAL DATA:  Elevated serum ammonia levels. Evaluate for possible hepatic cirrhosis. Current history of diabetes.  EXAM: ULTRASOUND ABDOMEN COMPLETE  COMPARISON:  None.  FINDINGS: Gallbladder: Small amount of echogenic sludge. No shadowing gallstones. No gallbladder wall thickening or pericholecystic fluid. Negative sonographic Murphy sign according to the ultrasound technologist.  Common bile duct: Diameter: Approximately 2 mm.  Liver: Normal size and echotexture without focal parenchymal abnormality. Patent portal vein with hepatopetal flow.  IVC: Patent.  Pancreas: Normal-appearing head and proximal body. Distal body and tail obscured by overlying bowel gas.  Spleen: Not visualized due to abundant bowel gas in the left side of the abdomen.  Right Kidney: Length: Approximately 10.4 cm. No hydronephrosis. Well-preserved cortex. No shadowing calculi. Normal parenchymal echotexture. No focal parenchymal abnormality.  Left Kidney: Not visualized due to abundant bowel gas in the left side of the abdomen.  Abdominal aorta: Normal in caliber throughout its visualized course in the abdomen with evidence of atherosclerosis. Maximum diameter 2.4 cm.  Other findings: None.  IMPRESSION: 1. No evidence of hepatic cirrhosis as the liver is normal in size and appearance by ultrasound. 2. Small amount of gallbladder sludge. No evidence of cholelithiasis or cholecystitis. 3. Obscuration of the distal body and tail of the pancreas, the spleen, and the left kidney due to abundant bowel gas.    Electronically Signed   By: Evangeline Dakin M.D.   On: 04/25/2014 12:32   Dg Chest Port 1 View  04/24/2014   CLINICAL DATA:  Confusion  EXAM: PORTABLE CHEST - 1 VIEW  COMPARISON:  None.  FINDINGS: Heart size upper normal.  Negative for heart failure.  Mild bibasilar airspace disease may represent pneumonia or atelectasis. No effusion. Hypoventilation with decreased lung volume.  IMPRESSION: Hypoventilation with bibasilar atelectasis/ infiltrate.   Electronically Signed   By: Franchot Gallo M.D.   On: 04/24/2014 08:25    Scheduled Meds: . ampicillin-sulbactam (UNASYN) IV  3 g Intravenous Q6H  . antiseptic oral rinse  7 mL Mouth Rinse q12n4p  . azithromycin  500 mg Intravenous Q24H  . chlorhexidine  15 mL Mouth Rinse BID  . enoxaparin (LOVENOX) injection  40 mg Subcutaneous Q24H  . haloperidol lactate  2 mg Intravenous Once  . [START ON 04/27/2014] Influenza vac split quadrivalent PF  0.5 mL Intramuscular Tomorrow-1000  . insulin aspart  0-5 Units Subcutaneous QHS  . insulin aspart  0-9 Units Subcutaneous TID WC  . lactulose  30 g Oral Daily  . [START ON 04/27/2014] pneumococcal 23 valent vaccine  0.5 mL Intramuscular Tomorrow-1000  . prednisoLONE acetate  1 drop Left Eye QID   Continuous Infusions: . 0.9 % NaCl with KCl 20 mEq / L 100 mL/hr at 04/25/14 1730    Time spent: 35 minutes  Eulogio Bear  [phone] [facility]  [email or web address], password [facility] 04/26/2014, 8:03 AM  LOS: 2 days

## 2014-04-27 DIAGNOSIS — A419 Sepsis, unspecified organism: Secondary | ICD-10-CM | POA: Diagnosis not present

## 2014-04-27 LAB — CBC
HEMATOCRIT: 52.3 % — AB (ref 39.0–52.0)
HEMOGLOBIN: 18.1 g/dL — AB (ref 13.0–17.0)
MCH: 29.1 pg (ref 26.0–34.0)
MCHC: 34.6 g/dL (ref 30.0–36.0)
MCV: 84.2 fL (ref 78.0–100.0)
Platelets: 168 10*3/uL (ref 150–400)
RBC: 6.21 MIL/uL — AB (ref 4.22–5.81)
RDW: 14 % (ref 11.5–15.5)
WBC: 7.9 10*3/uL (ref 4.0–10.5)

## 2014-04-27 LAB — COMPREHENSIVE METABOLIC PANEL
ALK PHOS: 94 U/L (ref 39–117)
ALT: 31 U/L (ref 0–53)
AST: 13 U/L (ref 0–37)
Albumin: 2.9 g/dL — ABNORMAL LOW (ref 3.5–5.2)
Anion gap: 14 (ref 5–15)
BUN: 12 mg/dL (ref 6–23)
CALCIUM: 8.8 mg/dL (ref 8.4–10.5)
CHLORIDE: 103 meq/L (ref 96–112)
CO2: 22 meq/L (ref 19–32)
Creatinine, Ser: 0.95 mg/dL (ref 0.50–1.35)
GFR, EST NON AFRICAN AMERICAN: 82 mL/min — AB (ref >90)
GLUCOSE: 198 mg/dL — AB (ref 70–99)
POTASSIUM: 3.9 meq/L (ref 3.7–5.3)
Sodium: 139 mEq/L (ref 137–147)
Total Bilirubin: 1 mg/dL (ref 0.3–1.2)
Total Protein: 6.4 g/dL (ref 6.0–8.3)

## 2014-04-27 LAB — GLUCOSE, CAPILLARY
GLUCOSE-CAPILLARY: 186 mg/dL — AB (ref 70–99)
Glucose-Capillary: 251 mg/dL — ABNORMAL HIGH (ref 70–99)
Glucose-Capillary: 265 mg/dL — ABNORMAL HIGH (ref 70–99)
Glucose-Capillary: 233 mg/dL — ABNORMAL HIGH (ref 70–99)

## 2014-04-27 LAB — AMMONIA: Ammonia: 36 umol/L (ref 11–60)

## 2014-04-27 MED ORDER — INSULIN GLARGINE 100 UNIT/ML ~~LOC~~ SOLN
10.0000 [IU] | Freq: Every day | SUBCUTANEOUS | Status: DC
  Administered 2014-04-27: 10 [IU] via SUBCUTANEOUS
  Filled 2014-04-27 (×2): qty 0.1

## 2014-04-27 MED ORDER — HYDRALAZINE HCL 20 MG/ML IJ SOLN
10.0000 mg | Freq: Four times a day (QID) | INTRAMUSCULAR | Status: DC | PRN
  Administered 2014-04-27: 10 mg via INTRAVENOUS
  Filled 2014-04-27: qty 1

## 2014-04-27 NOTE — Progress Notes (Signed)
[facility] PROGRESS NOTE  Stephen Frost QAS:[id number] DOB: 1940-05-28 DOA: 04/24/2014 PCP: Antony Blackbird, MD Summary: 73 year old white male diabetic who was found unresponsive by his ex-wife. He was found to be hypoglycemic, hypothermic. He had a lactic acidosis, possible pneumonia with leukocytosis. His mental status did not improve with fluid bolus, rewarming and D50.   Assessment/Plan:    Encephalopathy acute:   -EEG shows generalized slowing without epileptiform activity.  -MRI shows nothing acute.  -? encephalopathy from prolonged hypoglycemia, possibly hypoglycemic seizure, possibly sepsis contributing. - ammonia level is elevated at above 70. No history of liver disease or alcohol abuse. gave a few doses of lactulose and check an ultrasound of the abdomen to evaluate further.  -Patient's wife has also brought in a bottle of gabapentin 800 mg 3 times a day. Was discussed with Dr. Chapman Fitch, patient's primary care provider. Her records show that patient had been on 300 mg nightly. If this is a new prescription, may have contributed. Continue step down monitoring. Suspect patient will continue to improve.     Hypoglycemia:  Resolving resume low dose lantus SSI diet    probable Aspiration pneumonia with sepsis:  Continue Unasyn and azithromycin for now. Urine strep pneumonia antigen negative.    Hypothermia:  Resolved    DM type 2 (diabetes mellitus, type 2):  lantus resumed  Hyperlipidemia: Patient also on statin which has been held.  Will d/c sitter and transfer to floor  Code Status:  full Family Communication:  Wife at bedside Disposition Plan:    Consultants:    Procedures:     Antibiotics:  Unasyn, azithromycin 12/9--  HPI/Subjective: Awake, no overnight events   Objective: Filed Vitals:   04/27/14 0700  BP:   Pulse:   Temp: 98.1 F (36.7 C)  Resp:     Intake/Output Summary (Last 24 hours) at 04/27/14 0743 Last data filed at 04/27/14 7989  Gross per 24 hour  Intake   1280 ml  Output   1477 ml  Net   -197 ml   Filed Weights   04/24/14 1200 04/26/14 0300  Weight: 70.4 kg (155 lb 3.3 oz) 67.9 kg (149 lb 11.1 oz)    Exam:   General:  Awake- oriented to person, place and time   Cardiovascular: Regular rate rhythm without murmurs gallops rubs  Respiratory: Clear to auscultation bilaterally without wheezes rhonchi or rales  Abdomen: Soft nontender nondistended. Bowel sounds present.  Ext: No clubbing cyanosis or edema   Basic Metabolic Panel:  Recent Labs Lab 04/24/14 0830 04/25/14 0240 04/27/14 0325  NA 140 136* 139  K 4.2 3.8 3.9  CL 101 102 103  CO2 23 21 22   GLUCOSE 123* 82 198*  BUN 26* 12 12  CREATININE 0.89 0.82 0.95  CALCIUM 8.8 8.3* 8.8   Liver Function Tests:  Recent Labs Lab 04/24/14 0830 04/25/14 0240 04/27/14 0325  AST 64* 27 13  ALT 92* 53 31  ALKPHOS 115 91 94  BILITOT 0.7 0.9 1.0  PROT 7.2 5.9* 6.4  ALBUMIN 3.5 2.6* 2.9*    Recent Labs Lab 04/24/14 0830  LIPASE 20    Recent Labs Lab 04/24/14 1112 04/27/14 0325  AMMONIA 79* 36   CBC:  Recent Labs Lab 04/24/14 0830 04/25/14 0240 04/27/14 0325  WBC 23.1* 15.0* 7.9  NEUTROABS 20.4* 11.4*  --   HGB 20.9* 18.1* 18.1*  HCT 58.0* 50.8 52.3*  MCV 84.3 83.4 84.2  PLT 189 159 168   Cardiac Enzymes:  Recent Labs  Lab 04/24/14 1115  TROPONINI <0.30   BNP (last 3 results)  Recent Labs  04/24/14 0830  PROBNP 283.9*   CBG:  Recent Labs Lab 04/26/14 0346 04/26/14 0840 04/26/14 1059 04/26/14 1624 04/26/14 2119  GLUCAP 119* 105* 131* 195* 226*    Recent Results (from the past 240 hour(s))  Blood Culture (routine x 2)     Status: None (Preliminary result)   Collection Time: 04/24/14  8:30 AM  Result Value Ref Range Status   Specimen Description BLOOD LEFT ARM  Final   Special Requests BOTTLES DRAWN AEROBIC AND ANAEROBIC 5CC  Final   Culture  Setup Time   Final    04/24/2014 14:40 Performed at [facility]    Culture   Final           BLOOD CULTURE RECEIVED NO GROWTH TO DATE CULTURE WILL BE HELD FOR 5 DAYS BEFORE ISSUING A FINAL NEGATIVE REPORT Performed at [facility]    Report Status PENDING  Incomplete  Urine culture     Status: None   Collection Time: 04/24/14  8:30 AM  Result Value Ref Range Status   Specimen Description URINE, CATHETERIZED  Final   Special Requests NONE  Final   Culture  Setup Time   Final    04/24/2014 14:45 Performed at [place] Performed at [facility]   Final   Culture NO GROWTH Performed at [facility]   Final   Report Status 04/25/2014 FINAL  Final  Blood Culture (routine x 2)     Status: None (Preliminary result)   Collection Time: 04/24/14  8:40 AM  Result Value Ref Range Status   Specimen Description BLOOD RIGHT ANTECUBITAL  Final   Special Requests BOTTLES DRAWN AEROBIC AND ANAEROBIC 5CC  Final   Culture  Setup Time   Final    04/24/2014 14:39 Performed at [facility]    Culture   Final           BLOOD CULTURE RECEIVED NO GROWTH TO DATE CULTURE WILL BE HELD FOR 5 DAYS BEFORE ISSUING A FINAL NEGATIVE REPORT Performed at [facility]    Report Status PENDING  Incomplete  MRSA PCR Screening     Status: None   Collection Time: 04/24/14 12:42 PM  Result Value Ref Range Status   MRSA by PCR NEGATIVE NEGATIVE Final    Comment:        The GeneXpert MRSA Assay (FDA approved for NASAL specimens only), is one component of a comprehensive MRSA colonization surveillance program. It is not intended to diagnose MRSA infection nor to guide or monitor treatment for MRSA infections.      Studies: US Abdomen Complete  04/25/2014   CLINICAL DATA:  Elevated serum ammonia levels. Evaluate for possible hepatic cirrhosis. Current history of diabetes.  EXAM: ULTRASOUND ABDOMEN COMPLETE  COMPARISON:  None.  FINDINGS: Gallbladder: Small amount of echogenic sludge.  No shadowing gallstones. No gallbladder wall thickening or pericholecystic fluid. Negative sonographic Murphy sign according to the ultrasound technologist.  Common bile duct: Diameter: Approximately 2 mm.  Liver: Normal size and echotexture without focal parenchymal abnormality. Patent portal vein with hepatopetal flow.  IVC: Patent.  Pancreas: Normal-appearing head and proximal body. Distal body and tail obscured by overlying bowel gas.  Spleen: Not visualized due to abundant bowel gas in the left side of the abdomen.  Right Kidney: Length: Approximately 10.4 cm. No hydronephrosis. Well-preserved cortex.  No shadowing calculi. Normal parenchymal echotexture. No focal parenchymal abnormality.  Left Kidney: Not visualized due to abundant bowel gas in the left side of the abdomen.  Abdominal aorta: Normal in caliber throughout its visualized course in the abdomen with evidence of atherosclerosis. Maximum diameter 2.4 cm.  Other findings: None.  IMPRESSION: 1. No evidence of hepatic cirrhosis as the liver is normal in size and appearance by ultrasound. 2. Small amount of gallbladder sludge. No evidence of cholelithiasis or cholecystitis. 3. Obscuration of the distal body and tail of the pancreas, the spleen, and the left kidney due to abundant bowel gas.   Electronically Signed   By: Evangeline Dakin M.D.   On: 04/25/2014 12:32    Scheduled Meds: . ampicillin-sulbactam (UNASYN) IV  3 g Intravenous Q6H  . antiseptic oral rinse  7 mL Mouth Rinse q12n4p  . azithromycin  500 mg Oral Daily  . chlorhexidine  15 mL Mouth Rinse BID  . enoxaparin (LOVENOX) injection  40 mg Subcutaneous Q24H  . Influenza vac split quadrivalent PF  0.5 mL Intramuscular Tomorrow-1000  . insulin aspart  0-5 Units Subcutaneous QHS  . insulin aspart  0-9 Units Subcutaneous TID WC  . lactulose  30 g Oral Daily  . pneumococcal 23 valent vaccine  0.5 mL Intramuscular Tomorrow-1000  . prednisoLONE acetate  1 drop Left Eye QID   Continuous  Infusions: . 0.9 % NaCl with KCl 20 mEq / L 50 mL/hr at 04/26/14 2348    Time spent: 35 minutes  Eulogio Bear  [phone] [facility]  [email or web address], password [facility] 04/27/2014, 7:43 AM  LOS: 3 days

## 2014-04-27 NOTE — Evaluation (Signed)
Physical Therapy Evaluation Patient Details Name: Stephen Frost MRN: [id number] DOB: May 24, 1940 Today's Date: 04/27/2014   History of Present Illness  Pt found unresponsive and hypothermic by ex wife who rents a room from him. MRI of head negative for acute changes.  Probable aspiration PNA with sepsis. Encephalopathy from prolonged hypoglycemia. PMH:  DM  Clinical Impression  Pt will need continued therapy to maximize independence prior to returning to home.  At this time pt does not have 24hr care for pt at home and pt will need to be Mod I to return to home.  Will continue to follow while on acute.      Follow Up Recommendations SNF    Equipment Recommendations  None recommended by PT    Recommendations for Other Services       Precautions / Restrictions Precautions Precautions: Fall Restrictions Weight Bearing Restrictions: No      Mobility  Bed Mobility Overal bed mobility: Needs Assistance Bed Mobility: Supine to Sit     Supine to sit: Supervision     General bed mobility comments: Up with OT  Transfers Overall transfer level: Needs assistance Equipment used: None Transfers: Sit to/from Stand Sit to Stand: Min guard         General transfer comment: pt mildly unsteady and relies on UE support to A with balance.    Ambulation/Gait Ambulation/Gait assistance: Min guard Ambulation Distance (Feet): 200 Feet Assistive device: None Gait Pattern/deviations: Step-through pattern;Decreased stride length     General Gait Details: pt generally usteady and with increased balance deficits during cognitive tasks.  pt with occasional side steps to prevent LOB, but no physical A needed.    Stairs            Wheelchair Mobility    Modified Rankin (Stroke Patients Only)       Balance Overall balance assessment: Needs assistance Sitting-balance support: No upper extremity supported;Feet supported Sitting balance-Leahy Scale: Good     Standing balance  support: No upper extremity supported Standing balance-Leahy Scale: Fair                               Pertinent Vitals/Pain Pain Assessment: No/denies pain    Home Living Family/patient expects to be discharged to:: Private residence Living Arrangements: Other (Comment) (ex wife) Available Help at Discharge: Friend(s);Available PRN/intermittently Type of Home: House Home Access: Level entry     Home Layout: One level Home Equipment: None      Prior Function Level of Independence: Independent         Comments: does not drive     Hand Dominance   Dominant Hand: Right    Extremity/Trunk Assessment   Upper Extremity Assessment: Defer to OT evaluation           Lower Extremity Assessment: Overall WFL for tasks assessed         Communication   Communication: No difficulties  Cognition Arousal/Alertness: Awake/alert Behavior During Therapy: Impulsive Overall Cognitive Status: Impaired/Different from baseline Area of Impairment: Orientation;Attention;Memory;Safety/judgement;Awareness;Following commands Orientation Level: Disoriented to;Time;Situation (corrected date with cues ) Current Attention Level: Sustained Memory: Decreased short-term memory Following Commands: Follows one step commands consistently Safety/Judgement: Decreased awareness of safety;Decreased awareness of deficits     General Comments: impaired immediate memory    General Comments      Exercises        Assessment/Plan    PT Assessment Patient needs continued PT services  PT Diagnosis  Difficulty walking   PT Problem List Decreased activity tolerance;Decreased balance;Decreased mobility;Decreased coordination;Decreased cognition;Decreased knowledge of use of DME;Decreased safety awareness  PT Treatment Interventions DME instruction;Gait training;Stair training;Functional mobility training;Therapeutic activities;Therapeutic exercise;Balance training;Neuromuscular  re-education;Cognitive remediation;Patient/family education   PT Goals (Current goals can be found in the Care Plan section) Acute Rehab PT Goals Patient Stated Goal: get out of here PT Goal Formulation: With patient/family Time For Goal Achievement: 05/11/14 Potential to Achieve Goals: Good    Frequency Min 3X/week   Barriers to discharge        Co-evaluation PT/OT/SLP Co-Evaluation/Treatment: Yes Reason for Co-Treatment: Necessary to address cognition/behavior during functional activity PT goals addressed during session: Mobility/safety with mobility;Balance         End of Session Equipment Utilized During Treatment: Gait belt Activity Tolerance: Patient tolerated treatment well Patient left: in chair;with call bell/phone within reach;with family/visitor present Nurse Communication: Mobility status         Time: 1040-1057 PT Time Calculation (min) (ACUTE ONLY): 17 min   Charges:   PT Evaluation $Initial PT Evaluation Tier I: 1 Procedure     PT G CodesCatarina Hartshorn, [place] 04/27/2014, 12:13 PM

## 2014-04-27 NOTE — Progress Notes (Signed)
Patient continuously goes to the bathroom unassisted by nursing staff despite multiple instructions by RN to call for assistance.  Re-educated patient on use of call bell.  Patient verbalizes understanding; however, he does not follow instructions.  Yellow socks on.  A&Ox4.  Bed alarm on.  Will continue to monitor.

## 2014-04-27 NOTE — Progress Notes (Signed)
Utilization review completed.  

## 2014-04-27 NOTE — Progress Notes (Signed)
Inpatient Diabetes Program Recommendations  AACE/ADA: New Consensus Statement on Inpatient Glycemic Control (2013)  Target Ranges:  Prepandial:   less than 140 mg/dL      Peak postprandial:   less than 180 mg/dL (1-2 hours)      Critically ill patients:  140 - 180 mg/dL   Inpatient Diabetes Program Recommendations Diet: Added carbohydrate modified to the heart healthy diet with co-sign required Thank you, Rosita Kea, RN, CNS, Diabetes Coordinator [phone])

## 2014-04-27 NOTE — Evaluation (Signed)
Occupational Therapy Evaluation Patient Details Name: Stephen Frost MRN: [id number] DOB: July 13, 1940 Today's Date: 04/27/2014    History of Present Illness Pt found unresponsive and hypothermic by ex wife who rents a room from him. MRI of head negative for acute changes.  Probable aspiration PNA with sepsis. Encephalopathy from prolonged hypoglycemia. PMH:  DM   Clinical Impression   Pt was independent in ADL, IADL prior to admission.  Presents with impaired cognition and impaired balance interfering with independence.  Pt will need post acute rehab as he does not have 24 hour care at home.  Will follow acutely.    Follow Up Recommendations  SNF;Supervision/Assistance - 24 hour    Equipment Recommendations       Recommendations for Other Services       Precautions / Restrictions Precautions Precautions: Fall      Mobility Bed Mobility Overal bed mobility: Needs Assistance Bed Mobility: Supine to Sit     Supine to sit: Supervision        Transfers Overall transfer level: Needs assistance   Transfers: Sit to/from Stand Sit to Stand: Min guard              Balance                                            ADL Overall ADL's : Needs assistance/impaired Eating/Feeding: Supervision/ safety;Set up;Sitting   Grooming: Wash/dry hands;Supervision/safety;Standing   Upper Body Bathing: Minimal assitance;Sitting   Lower Body Bathing: Minimal assistance;Sit to/from stand   Upper Body Dressing : Minimal assistance;Standing   Lower Body Dressing: Minimal assistance;Sit to/from stand Lower Body Dressing Details (indicate cue type and reason): donned socks in bed with set up Toilet Transfer: Min guard;Ambulation;Regular Toilet;Grab bars   Toileting- Clothing Manipulation and Hygiene: Total assistance;Sit to/from stand Toileting - Clothing Manipulation Details (indicate cue type and reason): assist to clean up BM, pt with diarrhea     Functional  mobility during ADLs: Min guard       Vision                 Additional Comments: reports vision at baseline, wears readers   Perception     Praxis      Pertinent Vitals/Pain Pain Assessment: No/denies pain     Hand Dominance Right   Extremity/Trunk Assessment Upper Extremity Assessment Upper Extremity Assessment: Overall WFL for tasks assessed   Lower Extremity Assessment Lower Extremity Assessment: Defer to PT evaluation       Communication Communication Communication: No difficulties   Cognition Arousal/Alertness: Awake/alert Behavior During Therapy: Impulsive Overall Cognitive Status: Impaired/Different from baseline Area of Impairment: Orientation;Attention;Memory;Safety/judgement;Awareness;Following commands Orientation Level: Disoriented to;Time;Situation (corrected date with cues ) Current Attention Level: Sustained Memory: Decreased short-term memory Following Commands: Follows one step commands consistently Safety/Judgement: Decreased awareness of safety;Decreased awareness of deficits     General Comments: impaired immediate memory, able to use his cell phone and stated he would call 911 in the event an emergency at home when questioned   General Comments       Exercises       Shoulder Instructions      Home Living Family/patient expects to be discharged to:: Private residence Living Arrangements: Other (Comment) (ex wife) Available Help at Discharge: Friend(s);Available PRN/intermittently Type of Home: House Home Access: Level entry     Home Layout: One level  Bathroom Shower/Tub: Occupational psychologist: None          Prior Functioning/Environment Level of Independence: Independent        Comments: does not drive    OT Diagnosis: Generalized weakness;Cognitive deficits   OT Problem List: Impaired balance (sitting and/or standing);Decreased cognition;Decreased safety  awareness   OT Treatment/Interventions: Self-care/ADL training;Cognitive remediation/compensation;Therapeutic activities;Patient/family education    OT Goals(Current goals can be found in the care plan section) Acute Rehab OT Goals Patient Stated Goal: get out of here OT Goal Formulation: With patient Time For Goal Achievement: 05/11/14 Potential to Achieve Goals: Good ADL Goals Pt Will Perform Grooming: with supervision;standing Pt Will Perform Upper Body Bathing: with supervision;sitting Pt Will Perform Lower Body Bathing: with supervision;sit to/from stand Pt Will Perform Upper Body Dressing: with supervision;sitting Pt Will Perform Lower Body Dressing: with supervision;sit to/from stand Pt Will Transfer to Toilet: with supervision;regular height toilet;ambulating Pt Will Perform Toileting - Clothing Manipulation and hygiene: with supervision;sit to/from stand Additional ADL Goal #1: Pt will recall and gather 3 items in preparation for ADL with supervision.  OT Frequency: Min 2X/week   Barriers to D/C: Decreased caregiver support          Co-evaluation              End of Session Equipment Utilized During Treatment: Gait belt Nurse Communication:  (aware pt does not have a chair alarm, none in department)  Activity Tolerance: Patient tolerated treatment well Patient left: in chair;with call bell/phone within reach;with family/visitor present   Time: 9728-2060 OT Time Calculation (min): 32 min Charges:  OT General Charges $OT Visit: 1 Procedure OT Evaluation $Initial OT Evaluation Tier I: 1 Procedure OT Treatments $Self Care/Home Management : 8-22 mins G-Codes:    Malka So 04/27/2014, 12:03 PM  [phone]

## 2014-04-27 NOTE — Progress Notes (Signed)
Medicare Important Message given? YES  (If response is "NO", the following Medicare IM given date fields will be blank)  Date Medicare IM given: 04/27/14 Medicare IM given by:  Dametria Tuzzolino  

## 2014-04-28 LAB — GLUCOSE, CAPILLARY
GLUCOSE-CAPILLARY: 250 mg/dL — AB (ref 70–99)
GLUCOSE-CAPILLARY: 269 mg/dL — AB (ref 70–99)
Glucose-Capillary: 276 mg/dL — ABNORMAL HIGH (ref 70–99)
Glucose-Capillary: 216 mg/dL — ABNORMAL HIGH (ref 70–99)

## 2014-04-28 LAB — CBC
HCT: 49.7 % (ref 39.0–52.0)
Hemoglobin: 18 g/dL — ABNORMAL HIGH (ref 13.0–17.0)
MCH: 30.5 pg (ref 26.0–34.0)
MCHC: 36.2 g/dL — ABNORMAL HIGH (ref 30.0–36.0)
MCV: 84.2 fL (ref 78.0–100.0)
PLATELETS: 179 10*3/uL (ref 150–400)
RBC: 5.9 MIL/uL — ABNORMAL HIGH (ref 4.22–5.81)
RDW: 13.9 % (ref 11.5–15.5)
WBC: 9.9 10*3/uL (ref 4.0–10.5)

## 2014-04-28 LAB — BASIC METABOLIC PANEL
ANION GAP: 13 (ref 5–15)
BUN: 18 mg/dL (ref 6–23)
CHLORIDE: 104 meq/L (ref 96–112)
CO2: 22 mEq/L (ref 19–32)
CREATININE: 0.97 mg/dL (ref 0.50–1.35)
Calcium: 9.2 mg/dL (ref 8.4–10.5)
GFR calc non Af Amer: 81 mL/min — ABNORMAL LOW (ref >90)
Glucose, Bld: 268 mg/dL — ABNORMAL HIGH (ref 70–99)
Potassium: 4.3 mEq/L (ref 3.7–5.3)
SODIUM: 139 meq/L (ref 137–147)

## 2014-04-28 MED ORDER — INSULIN GLARGINE 100 UNIT/ML ~~LOC~~ SOLN
20.0000 [IU] | Freq: Every day | SUBCUTANEOUS | Status: DC
  Administered 2014-04-28: 20 [IU] via SUBCUTANEOUS
  Filled 2014-04-28 (×2): qty 0.2

## 2014-04-28 NOTE — Progress Notes (Signed)
[facility] PROGRESS NOTE  Stephen Frost TUU:[id number] DOB: 09-12-1940 DOA: 04/24/2014 PCP: Antony Blackbird, MD Summary: 73 year old white male diabetic who was found unresponsive by his ex-wife. He was found to be hypoglycemic, hypothermic. He had a lactic acidosis, possible pneumonia with leukocytosis. His mental status did not improve with fluid bolus, rewarming and D50.   Assessment/Plan:    Encephalopathy acute:   -EEG shows generalized slowing without epileptiform activity.  -MRI shows nothing acute.  -? encephalopathy from prolonged hypoglycemia, possibly hypoglycemic seizure, possibly sepsis contributing. - ammonia level is elevated at above 70. No history of liver disease or alcohol abuse. gave a few doses of lactulose  -improved -Patient's wife has also brought in a bottle of gabapentin 800 mg 3 times a day. Was discussed with Dr. Chapman Fitch, patient's primary care provider. Her records show that patient had been on 300 mg nightly. If this is a new prescription, may have contributed. Continue step down monitoring. Suspect patient will continue to improve.     Hypoglycemia:  Increase lantus SSI diet    probable Aspiration pneumonia with sepsis:  Continue Unasyn and azithromycin for now. Urine strep pneumonia antigen negative. -change to PO levaquin in AM    Hypothermia:  Resolved    DM type 2 (diabetes mellitus, type 2):  lantus resumed  Hyperlipidemia: Patient also on statin which has been held.  SNF  Code Status:  full Family Communication:  Wife at bedside Disposition Plan:  SNF  Consultants:    Procedures:     Antibiotics:  Unasyn, azithromycin 12/9--  HPI/Subjective: No issues   Objective: Filed Vitals:   04/28/14 0446  BP: 142/76  Pulse: 87  Temp: 98.4 F (36.9 C)  Resp: 19    Intake/Output Summary (Last 24 hours) at 04/28/14 0856 Last data filed at 04/28/14 7915  Gross per 24 hour  Intake    600 ml  Output    201 ml  Net    399 ml    Filed Weights   04/26/14 0300 04/27/14 2103 04/28/14 0446  Weight: 67.9 kg (149 lb 11.1 oz) 67.901 kg (149 lb 11.1 oz) 66.2 kg (145 lb 15.1 oz)    Exam:   General:  Awake- oriented to person, place and time   Cardiovascular: Regular rate rhythm without murmurs gallops rubs  Respiratory: Clear to auscultation bilaterally without wheezes rhonchi or rales  Abdomen: Soft nontender nondistended. Bowel sounds present.  Ext: No clubbing cyanosis or edema   Basic Metabolic Panel:  Recent Labs Lab 04/24/14 0830 04/25/14 0240 04/27/14 0325 04/28/14 0115  NA 140 136* 139 139  K 4.2 3.8 3.9 4.3  CL 101 102 103 104  CO2 23 21 22 22   GLUCOSE 123* 82 198* 268*  BUN 26* 12 12 18   CREATININE 0.89 0.82 0.95 0.97  CALCIUM 8.8 8.3* 8.8 9.2   Liver Function Tests:  Recent Labs Lab 04/24/14 0830 04/25/14 0240 04/27/14 0325  AST 64* 27 13  ALT 92* 53 31  ALKPHOS 115 91 94  BILITOT 0.7 0.9 1.0  PROT 7.2 5.9* 6.4  ALBUMIN 3.5 2.6* 2.9*    Recent Labs Lab 04/24/14 0830  LIPASE 20    Recent Labs Lab 04/24/14 1112 04/27/14 0325  AMMONIA 79* 36   CBC:  Recent Labs Lab 04/24/14 0830 04/25/14 0240 04/27/14 0325 04/28/14 0115  WBC 23.1* 15.0* 7.9 9.9  NEUTROABS 20.4* 11.4*  --   --   HGB 20.9* 18.1* 18.1* 18.0*  HCT 58.0* 50.8 52.3* 49.7  MCV 84.3 83.4 84.2 84.2  PLT 189 159 168 179   Cardiac Enzymes:  Recent Labs Lab 04/24/14 1115  TROPONINI <0.30   BNP (last 3 results)  Recent Labs  04/24/14 0830  PROBNP 283.9*   CBG:  Recent Labs Lab 04/27/14 0727 04/27/14 1226 04/27/14 1640 04/27/14 2115 04/28/14 0736  GLUCAP 186* 251* 265* 233* 276*    Recent Results (from the past 240 hour(s))  Blood Culture (routine x 2)     Status: None (Preliminary result)   Collection Time: 04/24/14  8:30 AM  Result Value Ref Range Status   Specimen Description BLOOD LEFT ARM  Final   Special Requests BOTTLES DRAWN AEROBIC AND ANAEROBIC 5CC  Final   Culture   Setup Time   Final    04/24/2014 14:40 Performed at [facility]    Culture   Final           BLOOD CULTURE RECEIVED NO GROWTH TO DATE CULTURE WILL BE HELD FOR 5 DAYS BEFORE ISSUING A FINAL NEGATIVE REPORT Performed at [facility]    Report Status PENDING  Incomplete  Urine culture     Status: None   Collection Time: 04/24/14  8:30 AM  Result Value Ref Range Status   Specimen Description URINE, CATHETERIZED  Final   Special Requests NONE  Final   Culture  Setup Time   Final    04/24/2014 14:45 Performed at [place] Performed at [facility]   Final   Culture NO GROWTH Performed at [facility]   Final   Report Status 04/25/2014 FINAL  Final  Blood Culture (routine x 2)     Status: None (Preliminary result)   Collection Time: 04/24/14  8:40 AM  Result Value Ref Range Status   Specimen Description BLOOD RIGHT ANTECUBITAL  Final   Special Requests BOTTLES DRAWN AEROBIC AND ANAEROBIC 5CC  Final   Culture  Setup Time   Final    04/24/2014 14:39 Performed at [facility]    Culture   Final           BLOOD CULTURE RECEIVED NO GROWTH TO DATE CULTURE WILL BE HELD FOR 5 DAYS BEFORE ISSUING A FINAL NEGATIVE REPORT Performed at [facility]    Report Status PENDING  Incomplete  MRSA PCR Screening     Status: None   Collection Time: 04/24/14 12:42 PM  Result Value Ref Range Status   MRSA by PCR NEGATIVE NEGATIVE Final    Comment:        The GeneXpert MRSA Assay (FDA approved for NASAL specimens only), is one component of a comprehensive MRSA colonization surveillance program. It is not intended to diagnose MRSA infection nor to guide or monitor treatment for MRSA infections.      Studies: No results found.  Scheduled Meds: . ampicillin-sulbactam (UNASYN) IV  3 g Intravenous Q6H  . azithromycin  500 mg Oral Daily  . enoxaparin (LOVENOX) injection  40 mg Subcutaneous Q24H  . insulin  aspart  0-5 Units Subcutaneous QHS  . insulin aspart  0-9 Units Subcutaneous TID WC  . insulin glargine  10 Units Subcutaneous Daily  . prednisoLONE acetate  1 drop Left Eye QID   Continuous Infusions:    Time spent: 35 minutes  Eliseo Squires Fateh Kindle  [phone] [facility]  [email or web address], password [facility] 04/28/2014, 8:56 AM  LOS: 4 days

## 2014-04-29 LAB — GLUCOSE, CAPILLARY
GLUCOSE-CAPILLARY: 219 mg/dL — AB (ref 70–99)
GLUCOSE-CAPILLARY: 240 mg/dL — AB (ref 70–99)
Glucose-Capillary: 181 mg/dL — ABNORMAL HIGH (ref 70–99)
Glucose-Capillary: 252 mg/dL — ABNORMAL HIGH (ref 70–99)

## 2014-04-29 MED ORDER — INSULIN GLARGINE 100 UNIT/ML ~~LOC~~ SOLN
25.0000 [IU] | Freq: Every day | SUBCUTANEOUS | Status: DC
  Administered 2014-04-29: 25 [IU] via SUBCUTANEOUS
  Filled 2014-04-29 (×2): qty 0.25

## 2014-04-29 MED ORDER — LEVOFLOXACIN 750 MG PO TABS
750.0000 mg | ORAL_TABLET | Freq: Every day | ORAL | Status: DC
  Administered 2014-04-29 – 2014-04-30 (×2): 750 mg via ORAL
  Filled 2014-04-29 (×2): qty 1

## 2014-04-29 NOTE — Progress Notes (Signed)
[facility] PROGRESS NOTE  Stephen Frost [id number] DOB: 16-Jun-1940 DOA: 04/24/2014 PCP: Antony Blackbird, MD Summary: 73 year old white male diabetic who was found unresponsive by his ex-wife. He was found to be hypoglycemic, hypothermic. He had a lactic acidosis, possible pneumonia with leukocytosis. His mental status did not improve with fluid bolus, rewarming and D50.   Assessment/Plan:   Encephalopathy acute:   -EEG shows generalized slowing without epileptiform activity.  -MRI shows nothing acute.  -? encephalopathy from prolonged hypoglycemia, possibly hypoglycemic seizure, possibly sepsis contributing. - ammonia level is elevated at above 70. No history of liver disease or alcohol abuse. gave a few doses of lactulose  -improved    Hypoglycemia:  Increase lantus SSI diet -diabetic education    probable Aspiration pneumonia with sepsis:  Continue Unasyn and azithromycin for now. Urine strep pneumonia antigen negative. -change to PO levaquin    Hypothermia:  Resolved    DM type 2 (diabetes mellitus, type 2):  lantus resumed  Hyperlipidemia: Patient also on statin which has been held.  SNF  Code Status:  full Family Communication:  Wife at bedside Disposition Plan:  SNF  Consultants:    Procedures:     Antibiotics:  Unasyn, azithromycin 12/9--12/13  levaquin 12/13  HPI/Subjective: No issues   Objective: Filed Vitals:   04/29/14 0922  BP: 143/68  Pulse: 85  Temp: 98.9 F (37.2 C)  Resp: 19    Intake/Output Summary (Last 24 hours) at 04/29/14 0951 Last data filed at 04/29/14 0912  Gross per 24 hour  Intake    540 ml  Output      0 ml  Net    540 ml   Filed Weights   04/26/14 0300 04/27/14 2103 04/28/14 0446  Weight: 67.9 kg (149 lb 11.1 oz) 67.901 kg (149 lb 11.1 oz) 66.2 kg (145 lb 15.1 oz)    Exam:   General:  Awake- oriented to person, place and time   Cardiovascular: Regular rate rhythm without murmurs gallops  rubs  Respiratory: Clear to auscultation bilaterally without wheezes rhonchi or rales  Abdomen: Soft nontender nondistended. Bowel sounds present.  Ext: No clubbing cyanosis or edema   Basic Metabolic Panel:  Recent Labs Lab 04/24/14 0830 04/25/14 0240 04/27/14 0325 04/28/14 0115  NA 140 136* 139 139  K 4.2 3.8 3.9 4.3  CL 101 102 103 104  CO2 23 21 22 22   GLUCOSE 123* 82 198* 268*  BUN 26* 12 12 18   CREATININE 0.89 0.82 0.95 0.97  CALCIUM 8.8 8.3* 8.8 9.2   Liver Function Tests:  Recent Labs Lab 04/24/14 0830 04/25/14 0240 04/27/14 0325  AST 64* 27 13  ALT 92* 53 31  ALKPHOS 115 91 94  BILITOT 0.7 0.9 1.0  PROT 7.2 5.9* 6.4  ALBUMIN 3.5 2.6* 2.9*    Recent Labs Lab 04/24/14 0830  LIPASE 20    Recent Labs Lab 04/24/14 1112 04/27/14 0325  AMMONIA 79* 36   CBC:  Recent Labs Lab 04/24/14 0830 04/25/14 0240 04/27/14 0325 04/28/14 0115  WBC 23.1* 15.0* 7.9 9.9  NEUTROABS 20.4* 11.4*  --   --   HGB 20.9* 18.1* 18.1* 18.0*  HCT 58.0* 50.8 52.3* 49.7  MCV 84.3 83.4 84.2 84.2  PLT 189 159 168 179   Cardiac Enzymes:  Recent Labs Lab 04/24/14 1115  TROPONINI <0.30   BNP (last 3 results)  Recent Labs  04/24/14 0830  PROBNP 283.9*   CBG:  Recent Labs Lab 04/28/14 0736 04/28/14 1115 04/28/14 1629  04/28/14 2126 04/29/14 0759  GLUCAP 276* 250* 269* 216* 181*    Recent Results (from the past 240 hour(s))  Blood Culture (routine x 2)     Status: None (Preliminary result)   Collection Time: 04/24/14  8:30 AM  Result Value Ref Range Status   Specimen Description BLOOD LEFT ARM  Final   Special Requests BOTTLES DRAWN AEROBIC AND ANAEROBIC 5CC  Final   Culture  Setup Time   Final    04/24/2014 14:40 Performed at [facility]    Culture   Final           BLOOD CULTURE RECEIVED NO GROWTH TO DATE CULTURE WILL BE HELD FOR 5 DAYS BEFORE ISSUING A FINAL NEGATIVE REPORT Performed at [facility]    Report Status PENDING   Incomplete  Urine culture     Status: None   Collection Time: 04/24/14  8:30 AM  Result Value Ref Range Status   Specimen Description URINE, CATHETERIZED  Final   Special Requests NONE  Final   Culture  Setup Time   Final    04/24/2014 14:45 Performed at [place] Performed at [facility]   Final   Culture NO GROWTH Performed at [facility]   Final   Report Status 04/25/2014 FINAL  Final  Blood Culture (routine x 2)     Status: None (Preliminary result)   Collection Time: 04/24/14  8:40 AM  Result Value Ref Range Status   Specimen Description BLOOD RIGHT ANTECUBITAL  Final   Special Requests BOTTLES DRAWN AEROBIC AND ANAEROBIC 5CC  Final   Culture  Setup Time   Final    04/24/2014 14:39 Performed at [facility]    Culture   Final           BLOOD CULTURE RECEIVED NO GROWTH TO DATE CULTURE WILL BE HELD FOR 5 DAYS BEFORE ISSUING A FINAL NEGATIVE REPORT Performed at [facility]    Report Status PENDING  Incomplete  MRSA PCR Screening     Status: None   Collection Time: 04/24/14 12:42 PM  Result Value Ref Range Status   MRSA by PCR NEGATIVE NEGATIVE Final    Comment:        The GeneXpert MRSA Assay (FDA approved for NASAL specimens only), is one component of a comprehensive MRSA colonization surveillance program. It is not intended to diagnose MRSA infection nor to guide or monitor treatment for MRSA infections.      Studies: No results found.  Scheduled Meds: . ampicillin-sulbactam (UNASYN) IV  3 g Intravenous Q6H  . azithromycin  500 mg Oral Daily  . enoxaparin (LOVENOX) injection  40 mg Subcutaneous Q24H  . insulin aspart  0-5 Units Subcutaneous QHS  . insulin aspart  0-9 Units Subcutaneous TID WC  . insulin glargine  20 Units Subcutaneous Daily  . prednisoLONE acetate  1 drop Left Eye QID   Continuous Infusions:    Time spent: 25 minutes  Eliseo Squires Ustin Cruickshank  [phone] [facility]  [email or web address], password [facility] 04/29/2014, 9:51 AM  LOS: 5 days

## 2014-04-30 LAB — CULTURE, BLOOD (ROUTINE X 2)
CULTURE: NO GROWTH
Culture: NO GROWTH

## 2014-04-30 LAB — GLUCOSE, CAPILLARY
GLUCOSE-CAPILLARY: 247 mg/dL — AB (ref 70–99)
Glucose-Capillary: 176 mg/dL — ABNORMAL HIGH (ref 70–99)

## 2014-04-30 MED ORDER — INSULIN GLARGINE 100 UNIT/ML ~~LOC~~ SOLN
30.0000 [IU] | Freq: Every day | SUBCUTANEOUS | Status: DC
  Administered 2014-04-30: 30 [IU] via SUBCUTANEOUS
  Filled 2014-04-30: qty 0.3

## 2014-04-30 MED ORDER — INSULIN ASPART 100 UNIT/ML ~~LOC~~ SOLN: 0.0000 [IU] | Freq: Three times a day (TID) | SUBCUTANEOUS | Status: DC

## 2014-04-30 MED ORDER — INSULIN GLARGINE 100 UNIT/ML ~~LOC~~ SOLN: 30.0000 [IU] | Freq: Every day | SUBCUTANEOUS | Status: DC

## 2014-04-30 MED ORDER — INSULIN LISPRO 100 UNIT/ML ~~LOC~~ SOLN: 2.0000 [IU] | SUBCUTANEOUS | Status: DC

## 2014-04-30 MED ORDER — INSULIN ASPART 100 UNIT/ML ~~LOC~~ SOLN: 0.0000 [IU] | Freq: Every day | SUBCUTANEOUS | Status: DC

## 2014-04-30 NOTE — Progress Notes (Signed)
Physical Therapy Treatment Patient Details Name: Stephen Frost MRN: [id number] DOB: 08-02-1940 Today's Date: 04/30/2014    History of Present Illness Pt found unresponsive and hypothermic by ex wife who rents a room from him. MRI of head negative for acute changes.  Probable aspiration PNA with sepsis. Encephalopathy from prolonged hypoglycemia. PMH:  DM    PT Comments    Pt progressing towards physical therapy goals. Pt was able to ambulate 480 feet in the halls without assistance. Recommending SPC use at d/c for balance and HHPT to follow. Pt's ex-wife present and expressed concerns regarding pt's insulin regimen and having assist for pt at home with medications. Discussed these concerns with CSW and CM as well as current level of function. Feel pt is safe to return home with use of SPC at this time. Will continue to follow until d/c.    Follow Up Recommendations  Home health PT;Supervision/Assistance - 24 hour     Equipment Recommendations  None recommended by PT    Recommendations for Other Services       Precautions / Restrictions Precautions Precautions: Fall Restrictions Weight Bearing Restrictions: No    Mobility  Bed Mobility               General bed mobility comments: Pt sitting up in recliner chair upon PT arrival.   Transfers Overall transfer level: Needs assistance Equipment used: None Transfers: Sit to/from Stand Sit to Stand: Modified independent (Device/Increase time)         General transfer comment: Pt with UE support to transition sit>stand. Immediately initiated ambulation once standing - no unsteadiness noted.   Ambulation/Gait Ambulation/Gait assistance: Supervision Ambulation Distance (Feet): 480 Feet Assistive device: None Gait Pattern/deviations: Step-through pattern;Decreased stride length;Staggering left;Staggering right Gait velocity: Decreased slightly Gait velocity interpretation: Below normal speed for age/gender General Gait  Details: Pt appears slightly unsteady with ambulation, however does not require assist and does not demonstrate LOB. Pt reaching for hall railing occasionally, and PT suggested use of SPC which pt has at home.    Stairs            Wheelchair Mobility    Modified Rankin (Stroke Patients Only)       Balance Overall balance assessment: Needs assistance Sitting-balance support: Feet supported;No upper extremity supported Sitting balance-Leahy Scale: Good     Standing balance support: No upper extremity supported Standing balance-Leahy Scale: Fair                      Cognition Arousal/Alertness: Awake/alert Behavior During Therapy: Impulsive Overall Cognitive Status: No family/caregiver present to determine baseline cognitive functioning                      Exercises      General Comments General comments (skin integrity, edema, etc.): Therapist answered all questions from pt and ex-wife. During this time pt rolled onto his side away from therapist and refused to be a part of the conversation with ex-wife.       Pertinent Vitals/Pain Pain Assessment: No/denies pain    Home Living                      Prior Function            PT Goals (current goals can now be found in the care plan section) Acute Rehab PT Goals Patient Stated Goal: Go home today PT Goal Formulation: With patient/family Time For Goal Achievement: 05/11/14 Potential  to Achieve Goals: Good Progress towards PT goals: Progressing toward goals    Frequency  Min 3X/week    PT Plan Discharge plan needs to be updated    Co-evaluation             End of Session           Time: 1448-1856 PT Time Calculation (min) (ACUTE ONLY): 18 min  Charges:  $Gait Training: 8-22 mins                    G Codes:      [name] 05-29-2014, 12:47 PM   [name], PT, DPT Acute Rehabilitation Services Pager: [phone]

## 2014-04-30 NOTE — Progress Notes (Signed)
Inpatient Diabetes Program Recommendations  AACE/ADA: New Consensus Statement on Inpatient Glycemic Control (2013)  Target Ranges:  Prepandial:   less than 140 mg/dL      Peak postprandial:   less than 180 mg/dL (1-2 hours)      Critically ill patients:  140 - 180 mg/dL  Consult received regarding patient d/c instructions and assessment. Pt is not clear headed as to what insulins he takes at home, when nor according to a correction scale (sliding scale). Spoke at length with patient and the woman who states she only rents a room from him. I asked her if he is normally this confused, as he is not clear on checking blood sugars, how to treat a low blood sugar nor what insulin to give for blood sugar levels. Pt had been on lantus 50 units bid at home which is a lot of insulin for his weight. At 0.4 units/kg to meet basal needs, the dose would calculate to 27 units/day. Pt now being d/c'd on 30 units which should be adequate to meet basal needs. I recommended per telephone to MD that pt states to me that he doesn't think he checks his blood sugars nor does he know if he gives humalog or lantus. He states he is not sure which insulin he took prior to the hypoglycemia, stating 'it was probably that humalog" When asked about his lantus doses, he does not know. It was extremely hard to keep his attention to teach and to answer questions as he was pacing across the room while talking. Reviewed signs and symptoms of hypoglycemia with him and his renter (woman). Attempted to tell him how to treat, and he answered with 'water'. Spoke with MD requesting that patient not be d/c'd on a correction scale (SSI) at home, as the concern is with which insulin he is giving when and not sure he will even check his blood sugars. [facility]. Pt does not want OP education-maybe he could be counseled at the [place] visit. Hopefully he will be more clear in his mind. Spoke with RN and explained teaching that should be  reinforced at this time.  Thank you, Rosita Kea, RN, CNS, Diabetes Coordinator [phone])

## 2014-04-30 NOTE — Clinical Social Work Psychosocial (Signed)
Clinical Social Work Department BRIEF PSYCHOSOCIAL ASSESSMENT 04/30/2014  Patient:  Stephen Frost, Stephen Frost     Account Number:  [id number]     Admit date:  04/24/2014  Clinical Social Worker:  Frederico Hamman  Date/Time:  04/30/2014 01:51 AM  Referred by:  Physician  Date Referred:  04/28/2014 Referred for  SNF Placement   Other Referral:   Interview type:  Patient Other interview type:   CSW also spoke with patient's ex-wife Langley Gauss who was at the bedside.    PSYCHOSOCIAL DATA Living Status:  FAMILY Admitted from facility:   Level of care:   Primary support name:  Latravion Graves Primary support relationship to patient:  FAMILY Degree of support available:   Patient lives with his ex-wife [place] Current Concerns  Post-Acute Placement   Other Concerns:    SOCIAL WORK ASSESSMENT / PLAN CSW talked with patient and his ex-wife Langley Gauss who was at the bedside regarding d/c planning and going home versus SNF.  CSW advised that ex-wife will be with patient during the day until she goes to work at [employer] around 3 pm until approximately 9 pm and then her son will check in on patient. Mr. Day stated that he has a walker and cane at home as well as a bath seat. Patient feels he will be fine at home and when asked, declined short-term rehab at a skilled facility. MD contacted and advised of patient's decision.   Assessment/plan status:  No Further Intervention Required Other assessment/ plan:   Information/referral to community resources:   None needed or requested at this time    PATIENT'S/FAMILY'S RESPONSE TO PLAN OF CARE: Patient and ex-wife Langley Gauss receptive to talking with CSW about discharge plans and feel that patient will be safe at home. CSW signing off as patient will discharge home today (04/30/14) with home health services.

## 2014-04-30 NOTE — Progress Notes (Signed)
Occupational Therapy Treatment and Discharge Patient Details Name: Stephen Frost MRN: [id number] DOB: 16-May-1941 Today's Date: 04/30/2014    History of present illness Pt found unresponsive and hypothermic by ex wife who rents a room from him. MRI of head negative for acute changes.  Probable aspiration PNA with sepsis. Encephalopathy from prolonged hypoglycemia. PMH:  DM   OT comments  Pt continues to be impulsive with poor attention and memory. He demonstrates decreased awareness of deficits.  Educated pt and ex-wife in deficits related to safety at home with executive functions, specifically managing medications, stove use, monitoring blood sugars, responding appropriately in an emergency, eating properly. Pt not able to make own follow up appointments, ex wife will need to assist.  HHOT omitted from discharge orders, informed case manager and requested [place] be added to address IADL and cognition in the home setting. Ex-wife has arranged 24 hour supervision for one week post discharge.  Follow Up Recommendations  Home health OT;Supervision/Assistance - 24 hour    Equipment Recommendations  None recommended by OT    Recommendations for Other Services      Precautions / Restrictions Precautions Precautions: Fall Restrictions Weight Bearing Restrictions: No       Mobility Bed Mobility               General bed mobility comments: Pt sitting up in recliner chair upon OT arrival.   Transfers Overall transfer level: Needs assistance Equipment used: None Transfers: Sit to/from Stand Sit to Stand: Modified independent (Device/Increase time)             Balance Overall balance assessment: Needs assistance Sitting-balance support: Feet supported;No upper extremity supported Sitting balance-Leahy Scale: Good     Standing balance support: No upper extremity supported Standing balance-Leahy Scale: Fair                     ADL                                        Functional mobility during ADLs: Supervision/safety General ADL Comments: Pt preparing for discharge home.  Performing ADL at a supervision level.  Able to gather items personal belongings from closet.      Vision                     Perception     Praxis      Cognition   Behavior During Therapy: Impulsive Overall Cognitive Status: Impaired/Different from baseline Area of Impairment: Safety/judgement;Memory;Attention;Awareness   Current Attention Level: Sustained Memory: Decreased short-term memory    Safety/Judgement: Decreased awareness of safety;Decreased awareness of deficits Awareness: Intellectual        Extremity/Trunk Assessment               Exercises     Shoulder Instructions       General Comments      Pertinent Vitals/ Pain       Pain Assessment: No/denies pain  Home Living                                          Prior Functioning/Environment              Frequency       Progress Toward Goals  OT Goals(current goals can  now be found in the care plan section)  Progress towards OT goals: Progressing toward goals  Acute Rehab OT Goals Patient Stated Goal: Go home today  Plan Discharge plan needs to be updated    Co-evaluation                 End of Session     Activity Tolerance Patient tolerated treatment well   Patient Left in chair;with call bell/phone within reach;with family/visitor present   Nurse Communication  (wife with questions about blood sugars)        Time: 1430-1454 OT Time Calculation (min): 24 min  Charges: OT General Charges $OT Visit: 1 Procedure OT Treatments $Self Care/Home Management : 23-37 mins  Malka So 04/30/2014, 3:12 PM  [phone]

## 2014-04-30 NOTE — Discharge Summary (Addendum)
Physician Discharge Summary  Stephen Frost UKG:[id number] DOB: 12-May-1941 DOA: 04/24/2014  PCP: Antony Blackbird, MD  Admit date: 04/24/2014 Discharge date: 04/30/2014  Time spent: 35 minutes  Recommendations for Outpatient Follow-up:  1. Further titration of DM meds as outpatient 2. Outpatient diabetic education  Discharge Diagnoses:  Principal Problem:   Encephalopathy acute Active Problems:   Hypoglycemia    probable Aspiration pneumonia with sepsis   Hypothermia   DM type 2 (diabetes mellitus, type 2)   Hyperammonemia   Discharge Condition: improved  Diet recommendation: cardiac/diabetic  Filed Weights   04/26/14 0300 04/27/14 2103 04/28/14 0446  Weight: 67.9 kg (149 lb 11.1 oz) 67.901 kg (149 lb 11.1 oz) 66.2 kg (145 lb 15.1 oz)    History of present illness:  Stephen Frost is a 73 y.o. male  With history of insulin-dependent diabetes who came to the emergency room after a family member found him unresponsive this morning. He was last seen normal last night at about 10:30 PM. This morning at about 7, his ex-wife was unable to rouse him out of bed. When EMS arrived, his blood glucose was 50. He was given D50 en route. In the emergency room, his blood glucose was 130. ABG shows mild metabolic acidosis and increased 80 A-a gradient. White blood cell count is 23,000. Hemoglobin 20.9. Hematocrit 58. Venous lactic acid 2.7. Chest x-ray shows possible by basilar infiltrates. CT brain shows nothing acute. Rectal temperature was 95.8. Heart rate and blood pressure normal. family reports that he was recently started and a new type of insulin a week ago. Since then, he reported having frequent lows. There were no reports of fever, chills, headache, cough yesterday. Patient has received antibiotics and fluid boluses for presumed pneumonia with sepsis. Mental status has not improved. He has no history of seizures, alcohol or drug abuse. Had recent surgery for left eye retinal detachment.  Also recently had biopsy of a lesion on his foot which came back as melanoma. He has an appointment with someone at [facility] for follow-up.   Hospital Course:  Encephalopathy acute:  -EEG shows generalized slowing without epileptiform activity.  -MRI shows nothing acute.  -encephalopathy from prolonged hypoglycemia, possibly hypoglycemic seizure, possibly sepsis contributing. - ammonia level was elevated above 70. No history of liver disease or alcohol abuse. gave a few doses of lactulose  -improved   Hypoglycemia: Increase lantus as tolerated diet -diabetic education outpatient   probable Aspiration pneumonia with sepsis: - Urine strep pneumonia antigen negative. -change to PO levaquin   Hypothermia: Resolved   DM type 2 (diabetes mellitus, type 2): lantus resumed  Hyperlipidemia: resume statin  Procedures:  none  Consultations:  none  Discharge Exam: Filed Vitals:   04/30/14 0432  BP: 140/88  Pulse: 80  Temp: 98.1 F (36.7 C)  Resp: 19    General: awake, NAD- wants to go home Cardiovascular: rrr Respiratory: clear  Discharge Instructions You were cared for by a hospitalist during your hospital stay. If you have any questions about your discharge medications or the care you received while you were in the hospital after you are discharged, you can call the unit and asked to speak with the hospitalist on call if the hospitalist that took care of you is not available. Once you are discharged, your primary care physician will handle any further medical issues. Please note that NO REFILLS for any discharge medications will be authorized once you are discharged, as it is imperative that you return to your primary care  physician (or establish a relationship with a primary care physician if you do not have one) for your aftercare needs so that they can reassess your need for medications and monitor your lab values.  Discharge Instructions    Diet - low sodium  heart healthy    Complete by:  As directed      Diet Carb Modified    Complete by:  As directed      Discharge instructions    Complete by:  As directed   Needs further titration of insulin as outpatient No driving 24 hour supervision     Increase activity slowly    Complete by:  As directed           Current Discharge Medication List    CONTINUE these medications which have CHANGED   Details  insulin glargine (LANTUS) 100 UNIT/ML injection Inject 0.3 mLs (30 Units total) into the skin daily. Qty: 10 mL, Refills: 11      CONTINUE these medications which have NOT CHANGED   Details  atorvastatin (LIPITOR) 80 MG tablet Take 40 mg by mouth at bedtime.    COMBIGAN 0.2-0.5 % ophthalmic solution Place 1 drop into the right eye every 12 (twelve) hours.     lisinopril (PRINIVIL,ZESTRIL) 20 MG tablet Take 20 mg by mouth daily. Refills: 6    prednisoLONE acetate (PRED FORTE) 1 % ophthalmic suspension Place 1 drop into the left eye 2 (two) times daily.       STOP taking these medications     gabapentin (NEURONTIN) 800 MG tablet      HUMALOG 100 UNIT/ML injection        Allergies  Allergen Reactions  . Metformin And Related Diarrhea   Follow-up Information    Follow up with FULP, CAMMIE, MD In 1 week.   Specialty:  Family Medicine   Contact information:   [place]  [address] [phone]        The results of significant diagnostics from this hospitalization (including imaging, microbiology, ancillary and laboratory) are listed below for reference.    Significant Diagnostic Studies: Ct Head Wo Contrast  04/24/2014   CLINICAL DATA:  73 year old diabetic male found unresponsive this morning. Initial encounter.  EXAM: CT HEAD WITHOUT CONTRAST  TECHNIQUE: Contiguous axial images were obtained from the base of the skull through the vertex without intravenous contrast.  COMPARISON:  11/15/2013.  FINDINGS: No intracranial hemorrhage.  Small  vessel disease type changes.  Left carotid terminus and M1 segment (bilaterally) of the middle cerebral artery appear dense and without change. This limits evaluation for detection of large acute infarct. No other CT evidence to suggest large acute infarct. If infarct remains of high clinical concern, MR imaging may be considered.  Mild atrophy. Mild ventricular prominence felt to be related to atrophy rather than hydrocephalus and without change.  No intracranial mass lesion noted on this unenhanced exam.  Vascular calcifications.  Opacification left sphenoid sinus.  Prior fracture right maxilla/zygomatic arch in an inferior right orbital floor.  IMPRESSION: No intracranial hemorrhage.  Small vessel disease type changes.  No CT evidence of large acute infarct. If infarct is of high clinical concern, MR imaging may be considered.  Opacification left sphenoid sinus.   Electronically Signed   By: Chauncey Cruel M.D.   On: 04/24/2014 09:07   Mr Brain Wo Contrast  04/24/2014   CLINICAL DATA:  Acute encephalopathy. Found unresponsive. Possible diabetic related hypoglycemia. Also history of melanoma.  EXAM:  MRI HEAD WITHOUT CONTRAST  TECHNIQUE: Multiplanar, multiecho pulse sequences of the brain and surrounding structures were obtained without intravenous contrast.  COMPARISON:  CT head earlier today.  FINDINGS: No evidence for acute infarction, hemorrhage, mass lesion, hydrocephalus, or extra-axial fluid. Mild to moderate cerebral and cerebellar atrophy, with ventricular prominence felt represent brain substance loss rather than hydrocephalus. Mild to moderate subcortical and periventricular T2 and FLAIR hyperintensities, likely chronic microvascular ischemic change.  Flow voids are maintained throughout the carotid, basilar, and vertebral arteries. Normal LEFT ICA terminus. There are no areas of chronic hemorrhage. Specifically, no abnormality on gradient sequence which might suggest melanotic lesion spread to the  brain.  Pituitary, pineal, and cerebellar tonsils unremarkable. No upper cervical lesions. Visualized calvarium, skull base, and upper cervical osseous structures unremarkable. Scalp and extracranial soft tissues, orbits, sinuses, and mastoids show no acute process.  IMPRESSION: Atrophy and small vessel disease.  No evidence for acute stroke.  No areas concerning for melanoma spread to the brain on this noncontrast exam.   Electronically Signed   By: Rolla Flatten M.D.   On: 04/24/2014 16:01   US Abdomen Complete  04/25/2014   CLINICAL DATA:  Elevated serum ammonia levels. Evaluate for possible hepatic cirrhosis. Current history of diabetes.  EXAM: ULTRASOUND ABDOMEN COMPLETE  COMPARISON:  None.  FINDINGS: Gallbladder: Small amount of echogenic sludge. No shadowing gallstones. No gallbladder wall thickening or pericholecystic fluid. Negative sonographic Murphy sign according to the ultrasound technologist.  Common bile duct: Diameter: Approximately 2 mm.  Liver: Normal size and echotexture without focal parenchymal abnormality. Patent portal vein with hepatopetal flow.  IVC: Patent.  Pancreas: Normal-appearing head and proximal body. Distal body and tail obscured by overlying bowel gas.  Spleen: Not visualized due to abundant bowel gas in the left side of the abdomen.  Right Kidney: Length: Approximately 10.4 cm. No hydronephrosis. Well-preserved cortex. No shadowing calculi. Normal parenchymal echotexture. No focal parenchymal abnormality.  Left Kidney: Not visualized due to abundant bowel gas in the left side of the abdomen.  Abdominal aorta: Normal in caliber throughout its visualized course in the abdomen with evidence of atherosclerosis. Maximum diameter 2.4 cm.  Other findings: None.  IMPRESSION: 1. No evidence of hepatic cirrhosis as the liver is normal in size and appearance by ultrasound. 2. Small amount of gallbladder sludge. No evidence of cholelithiasis or cholecystitis. 3. Obscuration of the distal  body and tail of the pancreas, the spleen, and the left kidney due to abundant bowel gas.   Electronically Signed   By: Evangeline Dakin M.D.   On: 04/25/2014 12:32   Dg Chest Port 1 View  04/24/2014   CLINICAL DATA:  Confusion  EXAM: PORTABLE CHEST - 1 VIEW  COMPARISON:  None.  FINDINGS: Heart size upper normal.  Negative for heart failure.  Mild bibasilar airspace disease may represent pneumonia or atelectasis. No effusion. Hypoventilation with decreased lung volume.  IMPRESSION: Hypoventilation with bibasilar atelectasis/ infiltrate.   Electronically Signed   By: Franchot Gallo M.D.   On: 04/24/2014 08:25    Microbiology: Recent Results (from the past 240 hour(s))  Blood Culture (routine x 2)     Status: None   Collection Time: 04/24/14  8:30 AM  Result Value Ref Range Status   Specimen Description BLOOD LEFT ARM  Final   Special Requests BOTTLES DRAWN AEROBIC AND ANAEROBIC 5CC  Final   Culture  Setup Time   Final    04/24/2014 14:40 Performed at [facility]  Culture   Final    NO GROWTH 5 DAYS Performed at [facility]    Report Status 04/30/2014 FINAL  Final  Urine culture     Status: None   Collection Time: 04/24/14  8:30 AM  Result Value Ref Range Status   Specimen Description URINE, CATHETERIZED  Final   Special Requests NONE  Final   Culture  Setup Time   Final    04/24/2014 14:45 Performed at [place] Performed at [facility]   Final   Culture NO GROWTH Performed at [facility]   Final   Report Status 04/25/2014 FINAL  Final  Blood Culture (routine x 2)     Status: None   Collection Time: 04/24/14  8:40 AM  Result Value Ref Range Status   Specimen Description BLOOD RIGHT ANTECUBITAL  Final   Special Requests BOTTLES DRAWN AEROBIC AND ANAEROBIC 5CC  Final   Culture  Setup Time   Final    04/24/2014 14:39 Performed at [facility]    Culture   Final    NO GROWTH 5 DAYS Performed  at [facility]    Report Status 04/30/2014 FINAL  Final  MRSA PCR Screening     Status: None   Collection Time: 04/24/14 12:42 PM  Result Value Ref Range Status   MRSA by PCR NEGATIVE NEGATIVE Final    Comment:        The GeneXpert MRSA Assay (FDA approved for NASAL specimens only), is one component of a comprehensive MRSA colonization surveillance program. It is not intended to diagnose MRSA infection nor to guide or monitor treatment for MRSA infections.      Labs: Basic Metabolic Panel:  Recent Labs Lab 04/24/14 0830 04/25/14 0240 04/27/14 0325 04/28/14 0115  NA 140 136* 139 139  K 4.2 3.8 3.9 4.3  CL 101 102 103 104  CO2 23 21 22 22   GLUCOSE 123* 82 198* 268*  BUN 26* 12 12 18   CREATININE 0.89 0.82 0.95 0.97  CALCIUM 8.8 8.3* 8.8 9.2   Liver Function Tests:  Recent Labs Lab 04/24/14 0830 04/25/14 0240 04/27/14 0325  AST 64* 27 13  ALT 92* 53 31  ALKPHOS 115 91 94  BILITOT 0.7 0.9 1.0  PROT 7.2 5.9* 6.4  ALBUMIN 3.5 2.6* 2.9*    Recent Labs Lab 04/24/14 0830  LIPASE 20    Recent Labs Lab 04/24/14 1112 04/27/14 0325  AMMONIA 79* 36   CBC:  Recent Labs Lab 04/24/14 0830 04/25/14 0240 04/27/14 0325 04/28/14 0115  WBC 23.1* 15.0* 7.9 9.9  NEUTROABS 20.4* 11.4*  --   --   HGB 20.9* 18.1* 18.1* 18.0*  HCT 58.0* 50.8 52.3* 49.7  MCV 84.3 83.4 84.2 84.2  PLT 189 159 168 179   Cardiac Enzymes:  Recent Labs Lab 04/24/14 1115  TROPONINI <0.30   BNP: BNP (last 3 results)  Recent Labs  04/24/14 0830  PROBNP 283.9*   CBG:  Recent Labs Lab 04/29/14 1115 04/29/14 1712 04/29/14 2015 04/30/14 0721 04/30/14 1140  GLUCAP 219* 240* 252* 176* 247*       Signed:  Xzavior Reinig  [facility] 04/30/2014, 2:22 PM

## 2014-04-30 NOTE — Care Management Note (Addendum)
CARE MANAGEMENT NOTE 04/30/2014  Patient:  IHAN, PAT   Account Number:  [id number]  Date Initiated:  04/26/2014  Documentation initiated by:  Marvetta Gibbons  Subjective/Objective Assessment:   Pt found down at home- admitted with acute encephalopathy     Action/Plan:   PTA pt lived at home- would benefit from PT/OT evals when able  04/30/2014 Met with pt and ex wife, [place] arranged with [facility] per pt choice pt has DME.   Anticipated DC Date:  04/30/2014   Anticipated DC Plan:  [place]  CM consult      Choice offered to / List presented to:          [facility] arranged  HH-2 PT  HH-1 RN    HH-3 OT [place].   Status of service:  Completed, signed off Medicare Important Message given?  YES (If response is "NO", the following Medicare IM given date fields will be blank) Date Medicare IM given:  04/27/2014 Medicare IM given by:  Marvetta Gibbons Date Additional Medicare IM given:  04/30/2014 Additional Medicare IM given by:  [facility]  Discharge Disposition:  [place]  Per UR Regulation:  Reviewed for med. necessity/level of care/duration of stay  If discussed at [place] of Stay Meetings, dates discussed:    Comments:

## 2014-04-30 NOTE — Progress Notes (Signed)
Patient discharge teaching given, including activity, diet, follow-up appoints, and medications. Patient verbalized understanding of all discharge instructions. IV access was d/c'd. Vitals are stable. Skin is intact except as charted in most recent assessments. Pt to be escorted out by NT, to be driven home by family.  Bertel Venard, MBA, BS, RN 

## 2014-05-09 ENCOUNTER — Ambulatory Visit
Admission: RE | Admit: 2014-05-09 | Discharge: 2014-05-09 | Disposition: A | Discharge status: Home / Self Care | Payer: Medicare HMO | Source: Ambulatory Visit | Attending: Family Medicine | Admitting: Family Medicine

## 2014-05-09 ENCOUNTER — Other Ambulatory Visit: Payer: Self-pay | Admitting: Family Medicine

## 2014-05-09 DIAGNOSIS — J690 Pneumonitis due to inhalation of food and vomit: Secondary | ICD-10-CM

## 2014-05-23 DIAGNOSIS — Z794 Long term (current) use of insulin: Secondary | ICD-10-CM | POA: Diagnosis not present

## 2014-05-23 DIAGNOSIS — E1165 Type 2 diabetes mellitus with hyperglycemia: Secondary | ICD-10-CM | POA: Diagnosis not present

## 2014-05-23 DIAGNOSIS — C4370 Malignant melanoma of unspecified lower limb, including hip: Secondary | ICD-10-CM | POA: Diagnosis not present

## 2014-05-26 DIAGNOSIS — Z794 Long term (current) use of insulin: Secondary | ICD-10-CM | POA: Diagnosis not present

## 2014-05-26 DIAGNOSIS — C4370 Malignant melanoma of unspecified lower limb, including hip: Secondary | ICD-10-CM | POA: Diagnosis not present

## 2014-05-26 DIAGNOSIS — E1165 Type 2 diabetes mellitus with hyperglycemia: Secondary | ICD-10-CM | POA: Diagnosis not present

## 2014-05-29 DIAGNOSIS — Z483 Aftercare following surgery for neoplasm: Secondary | ICD-10-CM | POA: Diagnosis not present

## 2014-06-05 DIAGNOSIS — E1165 Type 2 diabetes mellitus with hyperglycemia: Secondary | ICD-10-CM | POA: Diagnosis not present

## 2014-06-05 DIAGNOSIS — Z794 Long term (current) use of insulin: Secondary | ICD-10-CM | POA: Diagnosis not present

## 2014-06-05 DIAGNOSIS — C4370 Malignant melanoma of unspecified lower limb, including hip: Secondary | ICD-10-CM | POA: Diagnosis not present

## 2014-06-14 DIAGNOSIS — H31422 Serous choroidal detachment, left eye: Secondary | ICD-10-CM | POA: Diagnosis not present

## 2014-06-14 DIAGNOSIS — H47012 Ischemic optic neuropathy, left eye: Secondary | ICD-10-CM | POA: Diagnosis not present

## 2014-06-14 DIAGNOSIS — E11331 Type 2 diabetes mellitus with moderate nonproliferative diabetic retinopathy with macular edema: Secondary | ICD-10-CM | POA: Diagnosis not present

## 2014-06-14 DIAGNOSIS — Z961 Presence of intraocular lens: Secondary | ICD-10-CM | POA: Diagnosis not present

## 2014-06-14 DIAGNOSIS — H4011X0 Primary open-angle glaucoma, stage unspecified: Secondary | ICD-10-CM | POA: Diagnosis not present

## 2014-06-26 DIAGNOSIS — Z08 Encounter for follow-up examination after completed treatment for malignant neoplasm: Secondary | ICD-10-CM | POA: Diagnosis not present

## 2014-07-17 DIAGNOSIS — H4011X0 Primary open-angle glaucoma, stage unspecified: Secondary | ICD-10-CM | POA: Diagnosis not present

## 2014-07-17 DIAGNOSIS — Z961 Presence of intraocular lens: Secondary | ICD-10-CM | POA: Diagnosis not present

## 2014-07-17 DIAGNOSIS — H31422 Serous choroidal detachment, left eye: Secondary | ICD-10-CM | POA: Diagnosis not present

## 2014-07-17 DIAGNOSIS — E11331 Type 2 diabetes mellitus with moderate nonproliferative diabetic retinopathy with macular edema: Secondary | ICD-10-CM | POA: Diagnosis not present

## 2014-07-17 DIAGNOSIS — H47012 Ischemic optic neuropathy, left eye: Secondary | ICD-10-CM | POA: Diagnosis not present

## 2014-08-23 DIAGNOSIS — Z483 Aftercare following surgery for neoplasm: Secondary | ICD-10-CM | POA: Diagnosis not present

## 2014-08-23 DIAGNOSIS — D0371 Melanoma in situ of right lower limb, including hip: Secondary | ICD-10-CM | POA: Diagnosis not present

## 2014-08-30 DIAGNOSIS — H47012 Ischemic optic neuropathy, left eye: Secondary | ICD-10-CM | POA: Diagnosis not present

## 2014-08-30 DIAGNOSIS — R072 Precordial pain: Secondary | ICD-10-CM | POA: Diagnosis not present

## 2014-08-30 DIAGNOSIS — R1013 Epigastric pain: Secondary | ICD-10-CM | POA: Diagnosis not present

## 2014-08-30 DIAGNOSIS — R079 Chest pain, unspecified: Secondary | ICD-10-CM | POA: Diagnosis not present

## 2014-08-30 DIAGNOSIS — H4011X0 Primary open-angle glaucoma, stage unspecified: Secondary | ICD-10-CM | POA: Diagnosis not present

## 2014-08-30 DIAGNOSIS — Z961 Presence of intraocular lens: Secondary | ICD-10-CM | POA: Diagnosis not present

## 2014-08-30 DIAGNOSIS — E11331 Type 2 diabetes mellitus with moderate nonproliferative diabetic retinopathy with macular edema: Secondary | ICD-10-CM | POA: Diagnosis not present

## 2014-08-30 DIAGNOSIS — H31422 Serous choroidal detachment, left eye: Secondary | ICD-10-CM | POA: Diagnosis not present

## 2014-08-31 DIAGNOSIS — R072 Precordial pain: Secondary | ICD-10-CM | POA: Diagnosis not present

## 2014-09-01 DIAGNOSIS — R079 Chest pain, unspecified: Secondary | ICD-10-CM | POA: Diagnosis not present

## 2014-09-25 DIAGNOSIS — H47012 Ischemic optic neuropathy, left eye: Secondary | ICD-10-CM | POA: Diagnosis not present

## 2014-09-25 DIAGNOSIS — Z961 Presence of intraocular lens: Secondary | ICD-10-CM | POA: Diagnosis not present

## 2014-09-25 DIAGNOSIS — H4011X0 Primary open-angle glaucoma, stage unspecified: Secondary | ICD-10-CM | POA: Diagnosis not present

## 2014-09-25 DIAGNOSIS — E11331 Type 2 diabetes mellitus with moderate nonproliferative diabetic retinopathy with macular edema: Secondary | ICD-10-CM | POA: Diagnosis not present

## 2014-09-25 DIAGNOSIS — H31422 Serous choroidal detachment, left eye: Secondary | ICD-10-CM | POA: Diagnosis not present

## 2014-11-21 DIAGNOSIS — Z85820 Personal history of malignant melanoma of skin: Secondary | ICD-10-CM | POA: Diagnosis not present

## 2014-11-21 DIAGNOSIS — Z48817 Encounter for surgical aftercare following surgery on the skin and subcutaneous tissue: Secondary | ICD-10-CM | POA: Diagnosis not present

## 2014-11-21 DIAGNOSIS — L910 Hypertrophic scar: Secondary | ICD-10-CM | POA: Diagnosis not present

## 2014-11-29 DIAGNOSIS — H47012 Ischemic optic neuropathy, left eye: Secondary | ICD-10-CM | POA: Diagnosis not present

## 2014-11-29 DIAGNOSIS — Z961 Presence of intraocular lens: Secondary | ICD-10-CM | POA: Diagnosis not present

## 2014-11-29 DIAGNOSIS — H31422 Serous choroidal detachment, left eye: Secondary | ICD-10-CM | POA: Diagnosis not present

## 2014-11-29 DIAGNOSIS — H4011X0 Primary open-angle glaucoma, stage unspecified: Secondary | ICD-10-CM | POA: Diagnosis not present

## 2014-11-29 DIAGNOSIS — E11331 Type 2 diabetes mellitus with moderate nonproliferative diabetic retinopathy with macular edema: Secondary | ICD-10-CM | POA: Diagnosis not present

## 2014-12-24 DIAGNOSIS — H4011X0 Primary open-angle glaucoma, stage unspecified: Secondary | ICD-10-CM | POA: Diagnosis not present

## 2014-12-24 DIAGNOSIS — Z961 Presence of intraocular lens: Secondary | ICD-10-CM | POA: Diagnosis not present

## 2014-12-24 DIAGNOSIS — H47012 Ischemic optic neuropathy, left eye: Secondary | ICD-10-CM | POA: Diagnosis not present

## 2014-12-24 DIAGNOSIS — E11331 Type 2 diabetes mellitus with moderate nonproliferative diabetic retinopathy with macular edema: Secondary | ICD-10-CM | POA: Diagnosis not present

## 2014-12-24 DIAGNOSIS — H31422 Serous choroidal detachment, left eye: Secondary | ICD-10-CM | POA: Diagnosis not present

## 2015-01-09 DIAGNOSIS — H47012 Ischemic optic neuropathy, left eye: Secondary | ICD-10-CM | POA: Diagnosis not present

## 2015-01-09 DIAGNOSIS — H4011X3 Primary open-angle glaucoma, severe stage: Secondary | ICD-10-CM | POA: Diagnosis not present

## 2015-01-09 DIAGNOSIS — H4011X2 Primary open-angle glaucoma, moderate stage: Secondary | ICD-10-CM | POA: Diagnosis not present

## 2015-01-09 DIAGNOSIS — H31422 Serous choroidal detachment, left eye: Secondary | ICD-10-CM | POA: Diagnosis not present

## 2015-01-09 DIAGNOSIS — Z961 Presence of intraocular lens: Secondary | ICD-10-CM | POA: Diagnosis not present

## 2015-01-09 DIAGNOSIS — E11331 Type 2 diabetes mellitus with moderate nonproliferative diabetic retinopathy with macular edema: Secondary | ICD-10-CM | POA: Diagnosis not present

## 2015-02-07 DIAGNOSIS — Z961 Presence of intraocular lens: Secondary | ICD-10-CM | POA: Diagnosis not present

## 2015-02-07 DIAGNOSIS — H47012 Ischemic optic neuropathy, left eye: Secondary | ICD-10-CM | POA: Diagnosis not present

## 2015-02-07 DIAGNOSIS — H4011X3 Primary open-angle glaucoma, severe stage: Secondary | ICD-10-CM | POA: Diagnosis not present

## 2015-02-07 DIAGNOSIS — E11331 Type 2 diabetes mellitus with moderate nonproliferative diabetic retinopathy with macular edema: Secondary | ICD-10-CM | POA: Diagnosis not present

## 2015-02-07 DIAGNOSIS — H4011X2 Primary open-angle glaucoma, moderate stage: Secondary | ICD-10-CM | POA: Diagnosis not present

## 2015-02-07 DIAGNOSIS — H31422 Serous choroidal detachment, left eye: Secondary | ICD-10-CM | POA: Diagnosis not present

## 2015-02-19 DIAGNOSIS — H31422 Serous choroidal detachment, left eye: Secondary | ICD-10-CM | POA: Diagnosis not present

## 2015-02-19 DIAGNOSIS — E113313 Type 2 diabetes mellitus with moderate nonproliferative diabetic retinopathy with macular edema, bilateral: Secondary | ICD-10-CM | POA: Diagnosis not present

## 2015-02-19 DIAGNOSIS — Z961 Presence of intraocular lens: Secondary | ICD-10-CM | POA: Diagnosis not present

## 2015-02-19 DIAGNOSIS — H47012 Ischemic optic neuropathy, left eye: Secondary | ICD-10-CM | POA: Diagnosis not present

## 2015-02-19 DIAGNOSIS — H401112 Primary open-angle glaucoma, right eye, moderate stage: Secondary | ICD-10-CM | POA: Diagnosis not present

## 2015-02-19 DIAGNOSIS — H401123 Primary open-angle glaucoma, left eye, severe stage: Secondary | ICD-10-CM | POA: Diagnosis not present

## 2015-03-12 DIAGNOSIS — H31422 Serous choroidal detachment, left eye: Secondary | ICD-10-CM | POA: Diagnosis not present

## 2015-03-12 DIAGNOSIS — H47012 Ischemic optic neuropathy, left eye: Secondary | ICD-10-CM | POA: Diagnosis not present

## 2015-03-12 DIAGNOSIS — H401123 Primary open-angle glaucoma, left eye, severe stage: Secondary | ICD-10-CM | POA: Diagnosis not present

## 2015-03-12 DIAGNOSIS — Z961 Presence of intraocular lens: Secondary | ICD-10-CM | POA: Diagnosis not present

## 2015-03-12 DIAGNOSIS — E113313 Type 2 diabetes mellitus with moderate nonproliferative diabetic retinopathy with macular edema, bilateral: Secondary | ICD-10-CM | POA: Diagnosis not present

## 2015-03-12 DIAGNOSIS — H401112 Primary open-angle glaucoma, right eye, moderate stage: Secondary | ICD-10-CM | POA: Diagnosis not present

## 2015-06-05 DIAGNOSIS — E113313 Type 2 diabetes mellitus with moderate nonproliferative diabetic retinopathy with macular edema, bilateral: Secondary | ICD-10-CM | POA: Diagnosis not present

## 2015-06-05 DIAGNOSIS — Z961 Presence of intraocular lens: Secondary | ICD-10-CM | POA: Diagnosis not present

## 2015-06-05 DIAGNOSIS — H401123 Primary open-angle glaucoma, left eye, severe stage: Secondary | ICD-10-CM | POA: Diagnosis not present

## 2015-06-05 DIAGNOSIS — H401112 Primary open-angle glaucoma, right eye, moderate stage: Secondary | ICD-10-CM | POA: Diagnosis not present

## 2015-06-05 DIAGNOSIS — H47012 Ischemic optic neuropathy, left eye: Secondary | ICD-10-CM | POA: Diagnosis not present

## 2015-08-09 DIAGNOSIS — R4182 Altered mental status, unspecified: Secondary | ICD-10-CM | POA: Diagnosis not present

## 2015-08-09 DIAGNOSIS — R918 Other nonspecific abnormal finding of lung field: Secondary | ICD-10-CM | POA: Diagnosis not present

## 2015-08-09 DIAGNOSIS — R531 Weakness: Secondary | ICD-10-CM | POA: Diagnosis not present

## 2015-08-11 DIAGNOSIS — Z794 Long term (current) use of insulin: Secondary | ICD-10-CM | POA: Diagnosis not present

## 2015-08-11 DIAGNOSIS — E1165 Type 2 diabetes mellitus with hyperglycemia: Secondary | ICD-10-CM | POA: Diagnosis not present

## 2015-08-11 DIAGNOSIS — Z7984 Long term (current) use of oral hypoglycemic drugs: Secondary | ICD-10-CM | POA: Diagnosis not present

## 2015-08-11 DIAGNOSIS — R296 Repeated falls: Secondary | ICD-10-CM | POA: Diagnosis not present

## 2015-08-11 DIAGNOSIS — K219 Gastro-esophageal reflux disease without esophagitis: Secondary | ICD-10-CM | POA: Diagnosis not present

## 2015-08-11 DIAGNOSIS — E785 Hyperlipidemia, unspecified: Secondary | ICD-10-CM | POA: Diagnosis not present

## 2015-08-11 DIAGNOSIS — Z9181 History of falling: Secondary | ICD-10-CM | POA: Diagnosis not present

## 2015-08-14 DIAGNOSIS — Z794 Long term (current) use of insulin: Secondary | ICD-10-CM | POA: Diagnosis not present

## 2015-08-14 DIAGNOSIS — Z7984 Long term (current) use of oral hypoglycemic drugs: Secondary | ICD-10-CM | POA: Diagnosis not present

## 2015-08-14 DIAGNOSIS — E785 Hyperlipidemia, unspecified: Secondary | ICD-10-CM | POA: Diagnosis not present

## 2015-08-14 DIAGNOSIS — E1165 Type 2 diabetes mellitus with hyperglycemia: Secondary | ICD-10-CM | POA: Diagnosis not present

## 2015-08-14 DIAGNOSIS — R296 Repeated falls: Secondary | ICD-10-CM | POA: Diagnosis not present

## 2015-08-14 DIAGNOSIS — K219 Gastro-esophageal reflux disease without esophagitis: Secondary | ICD-10-CM | POA: Diagnosis not present

## 2015-08-14 DIAGNOSIS — Z9181 History of falling: Secondary | ICD-10-CM | POA: Diagnosis not present

## 2015-08-15 DIAGNOSIS — K219 Gastro-esophageal reflux disease without esophagitis: Secondary | ICD-10-CM | POA: Diagnosis not present

## 2015-08-15 DIAGNOSIS — Z7984 Long term (current) use of oral hypoglycemic drugs: Secondary | ICD-10-CM | POA: Diagnosis not present

## 2015-08-15 DIAGNOSIS — Z794 Long term (current) use of insulin: Secondary | ICD-10-CM | POA: Diagnosis not present

## 2015-08-15 DIAGNOSIS — E785 Hyperlipidemia, unspecified: Secondary | ICD-10-CM | POA: Diagnosis not present

## 2015-08-15 DIAGNOSIS — R296 Repeated falls: Secondary | ICD-10-CM | POA: Diagnosis not present

## 2015-08-15 DIAGNOSIS — Z9181 History of falling: Secondary | ICD-10-CM | POA: Diagnosis not present

## 2015-08-15 DIAGNOSIS — E1165 Type 2 diabetes mellitus with hyperglycemia: Secondary | ICD-10-CM | POA: Diagnosis not present

## 2015-08-20 DIAGNOSIS — E785 Hyperlipidemia, unspecified: Secondary | ICD-10-CM | POA: Diagnosis not present

## 2015-08-20 DIAGNOSIS — Z9181 History of falling: Secondary | ICD-10-CM | POA: Diagnosis not present

## 2015-08-20 DIAGNOSIS — Z794 Long term (current) use of insulin: Secondary | ICD-10-CM | POA: Diagnosis not present

## 2015-08-20 DIAGNOSIS — R296 Repeated falls: Secondary | ICD-10-CM | POA: Diagnosis not present

## 2015-08-20 DIAGNOSIS — Z7984 Long term (current) use of oral hypoglycemic drugs: Secondary | ICD-10-CM | POA: Diagnosis not present

## 2015-08-20 DIAGNOSIS — K219 Gastro-esophageal reflux disease without esophagitis: Secondary | ICD-10-CM | POA: Diagnosis not present

## 2015-08-20 DIAGNOSIS — E1165 Type 2 diabetes mellitus with hyperglycemia: Secondary | ICD-10-CM | POA: Diagnosis not present

## 2015-08-21 DIAGNOSIS — E785 Hyperlipidemia, unspecified: Secondary | ICD-10-CM | POA: Diagnosis not present

## 2015-08-21 DIAGNOSIS — R296 Repeated falls: Secondary | ICD-10-CM | POA: Diagnosis not present

## 2015-08-21 DIAGNOSIS — E1165 Type 2 diabetes mellitus with hyperglycemia: Secondary | ICD-10-CM | POA: Diagnosis not present

## 2015-08-21 DIAGNOSIS — Z794 Long term (current) use of insulin: Secondary | ICD-10-CM | POA: Diagnosis not present

## 2015-08-21 DIAGNOSIS — Z9181 History of falling: Secondary | ICD-10-CM | POA: Diagnosis not present

## 2015-08-21 DIAGNOSIS — Z7984 Long term (current) use of oral hypoglycemic drugs: Secondary | ICD-10-CM | POA: Diagnosis not present

## 2015-08-21 DIAGNOSIS — K219 Gastro-esophageal reflux disease without esophagitis: Secondary | ICD-10-CM | POA: Diagnosis not present

## 2015-08-22 DIAGNOSIS — K219 Gastro-esophageal reflux disease without esophagitis: Secondary | ICD-10-CM | POA: Diagnosis not present

## 2015-08-22 DIAGNOSIS — R296 Repeated falls: Secondary | ICD-10-CM | POA: Diagnosis not present

## 2015-08-22 DIAGNOSIS — Z7984 Long term (current) use of oral hypoglycemic drugs: Secondary | ICD-10-CM | POA: Diagnosis not present

## 2015-08-22 DIAGNOSIS — Z9181 History of falling: Secondary | ICD-10-CM | POA: Diagnosis not present

## 2015-08-22 DIAGNOSIS — E785 Hyperlipidemia, unspecified: Secondary | ICD-10-CM | POA: Diagnosis not present

## 2015-08-22 DIAGNOSIS — E1165 Type 2 diabetes mellitus with hyperglycemia: Secondary | ICD-10-CM | POA: Diagnosis not present

## 2015-08-22 DIAGNOSIS — Z794 Long term (current) use of insulin: Secondary | ICD-10-CM | POA: Diagnosis not present

## 2015-08-28 DIAGNOSIS — E785 Hyperlipidemia, unspecified: Secondary | ICD-10-CM | POA: Diagnosis not present

## 2015-08-28 DIAGNOSIS — Z9181 History of falling: Secondary | ICD-10-CM | POA: Diagnosis not present

## 2015-08-28 DIAGNOSIS — R296 Repeated falls: Secondary | ICD-10-CM | POA: Diagnosis not present

## 2015-08-28 DIAGNOSIS — Z7984 Long term (current) use of oral hypoglycemic drugs: Secondary | ICD-10-CM | POA: Diagnosis not present

## 2015-08-28 DIAGNOSIS — E1165 Type 2 diabetes mellitus with hyperglycemia: Secondary | ICD-10-CM | POA: Diagnosis not present

## 2015-08-28 DIAGNOSIS — K219 Gastro-esophageal reflux disease without esophagitis: Secondary | ICD-10-CM | POA: Diagnosis not present

## 2015-08-28 DIAGNOSIS — Z794 Long term (current) use of insulin: Secondary | ICD-10-CM | POA: Diagnosis not present

## 2015-08-29 DIAGNOSIS — Z7984 Long term (current) use of oral hypoglycemic drugs: Secondary | ICD-10-CM | POA: Diagnosis not present

## 2015-08-29 DIAGNOSIS — Z9181 History of falling: Secondary | ICD-10-CM | POA: Diagnosis not present

## 2015-08-29 DIAGNOSIS — K219 Gastro-esophageal reflux disease without esophagitis: Secondary | ICD-10-CM | POA: Diagnosis not present

## 2015-08-29 DIAGNOSIS — E785 Hyperlipidemia, unspecified: Secondary | ICD-10-CM | POA: Diagnosis not present

## 2015-08-29 DIAGNOSIS — R296 Repeated falls: Secondary | ICD-10-CM | POA: Diagnosis not present

## 2015-08-29 DIAGNOSIS — Z794 Long term (current) use of insulin: Secondary | ICD-10-CM | POA: Diagnosis not present

## 2015-08-29 DIAGNOSIS — E1165 Type 2 diabetes mellitus with hyperglycemia: Secondary | ICD-10-CM | POA: Diagnosis not present

## 2015-08-30 DIAGNOSIS — Z794 Long term (current) use of insulin: Secondary | ICD-10-CM | POA: Diagnosis not present

## 2015-08-30 DIAGNOSIS — K219 Gastro-esophageal reflux disease without esophagitis: Secondary | ICD-10-CM | POA: Diagnosis not present

## 2015-08-30 DIAGNOSIS — Z7984 Long term (current) use of oral hypoglycemic drugs: Secondary | ICD-10-CM | POA: Diagnosis not present

## 2015-08-30 DIAGNOSIS — Z9181 History of falling: Secondary | ICD-10-CM | POA: Diagnosis not present

## 2015-08-30 DIAGNOSIS — E1165 Type 2 diabetes mellitus with hyperglycemia: Secondary | ICD-10-CM | POA: Diagnosis not present

## 2015-08-30 DIAGNOSIS — E785 Hyperlipidemia, unspecified: Secondary | ICD-10-CM | POA: Diagnosis not present

## 2015-08-30 DIAGNOSIS — R296 Repeated falls: Secondary | ICD-10-CM | POA: Diagnosis not present

## 2015-09-05 DIAGNOSIS — R296 Repeated falls: Secondary | ICD-10-CM | POA: Diagnosis not present

## 2015-09-05 DIAGNOSIS — E1165 Type 2 diabetes mellitus with hyperglycemia: Secondary | ICD-10-CM | POA: Diagnosis not present

## 2015-09-05 DIAGNOSIS — Z794 Long term (current) use of insulin: Secondary | ICD-10-CM | POA: Diagnosis not present

## 2015-09-05 DIAGNOSIS — Z9181 History of falling: Secondary | ICD-10-CM | POA: Diagnosis not present

## 2015-09-05 DIAGNOSIS — E785 Hyperlipidemia, unspecified: Secondary | ICD-10-CM | POA: Diagnosis not present

## 2015-09-05 DIAGNOSIS — Z7984 Long term (current) use of oral hypoglycemic drugs: Secondary | ICD-10-CM | POA: Diagnosis not present

## 2015-09-05 DIAGNOSIS — K219 Gastro-esophageal reflux disease without esophagitis: Secondary | ICD-10-CM | POA: Diagnosis not present

## 2015-09-06 DIAGNOSIS — K219 Gastro-esophageal reflux disease without esophagitis: Secondary | ICD-10-CM | POA: Diagnosis not present

## 2015-09-06 DIAGNOSIS — Z7984 Long term (current) use of oral hypoglycemic drugs: Secondary | ICD-10-CM | POA: Diagnosis not present

## 2015-09-06 DIAGNOSIS — E785 Hyperlipidemia, unspecified: Secondary | ICD-10-CM | POA: Diagnosis not present

## 2015-09-06 DIAGNOSIS — E1165 Type 2 diabetes mellitus with hyperglycemia: Secondary | ICD-10-CM | POA: Diagnosis not present

## 2015-09-06 DIAGNOSIS — R296 Repeated falls: Secondary | ICD-10-CM | POA: Diagnosis not present

## 2015-09-06 DIAGNOSIS — Z9181 History of falling: Secondary | ICD-10-CM | POA: Diagnosis not present

## 2015-09-06 DIAGNOSIS — Z794 Long term (current) use of insulin: Secondary | ICD-10-CM | POA: Diagnosis not present

## 2015-09-09 DIAGNOSIS — R296 Repeated falls: Secondary | ICD-10-CM | POA: Diagnosis not present

## 2015-09-09 DIAGNOSIS — Z794 Long term (current) use of insulin: Secondary | ICD-10-CM | POA: Diagnosis not present

## 2015-09-09 DIAGNOSIS — E785 Hyperlipidemia, unspecified: Secondary | ICD-10-CM | POA: Diagnosis not present

## 2015-09-09 DIAGNOSIS — Z7984 Long term (current) use of oral hypoglycemic drugs: Secondary | ICD-10-CM | POA: Diagnosis not present

## 2015-09-09 DIAGNOSIS — E1165 Type 2 diabetes mellitus with hyperglycemia: Secondary | ICD-10-CM | POA: Diagnosis not present

## 2015-09-09 DIAGNOSIS — Z9181 History of falling: Secondary | ICD-10-CM | POA: Diagnosis not present

## 2015-09-09 DIAGNOSIS — K219 Gastro-esophageal reflux disease without esophagitis: Secondary | ICD-10-CM | POA: Diagnosis not present

## 2015-09-24 DIAGNOSIS — E1165 Type 2 diabetes mellitus with hyperglycemia: Secondary | ICD-10-CM | POA: Diagnosis not present

## 2015-09-24 DIAGNOSIS — Z7984 Long term (current) use of oral hypoglycemic drugs: Secondary | ICD-10-CM | POA: Diagnosis not present

## 2015-09-24 DIAGNOSIS — E785 Hyperlipidemia, unspecified: Secondary | ICD-10-CM | POA: Diagnosis not present

## 2015-09-24 DIAGNOSIS — Z794 Long term (current) use of insulin: Secondary | ICD-10-CM | POA: Diagnosis not present

## 2015-09-24 DIAGNOSIS — Z9181 History of falling: Secondary | ICD-10-CM | POA: Diagnosis not present

## 2015-09-24 DIAGNOSIS — R296 Repeated falls: Secondary | ICD-10-CM | POA: Diagnosis not present

## 2015-09-24 DIAGNOSIS — K219 Gastro-esophageal reflux disease without esophagitis: Secondary | ICD-10-CM | POA: Diagnosis not present

## 2015-10-08 DIAGNOSIS — K219 Gastro-esophageal reflux disease without esophagitis: Secondary | ICD-10-CM | POA: Diagnosis not present

## 2015-10-08 DIAGNOSIS — Z7984 Long term (current) use of oral hypoglycemic drugs: Secondary | ICD-10-CM | POA: Diagnosis not present

## 2015-10-08 DIAGNOSIS — E1165 Type 2 diabetes mellitus with hyperglycemia: Secondary | ICD-10-CM | POA: Diagnosis not present

## 2015-10-08 DIAGNOSIS — R296 Repeated falls: Secondary | ICD-10-CM | POA: Diagnosis not present

## 2015-10-08 DIAGNOSIS — E785 Hyperlipidemia, unspecified: Secondary | ICD-10-CM | POA: Diagnosis not present

## 2015-10-08 DIAGNOSIS — Z9181 History of falling: Secondary | ICD-10-CM | POA: Diagnosis not present

## 2015-10-08 DIAGNOSIS — Z794 Long term (current) use of insulin: Secondary | ICD-10-CM | POA: Diagnosis not present

## 2015-10-11 DIAGNOSIS — T1490 Injury, unspecified: Secondary | ICD-10-CM | POA: Diagnosis not present

## 2015-11-28 DIAGNOSIS — Z483 Aftercare following surgery for neoplasm: Secondary | ICD-10-CM | POA: Diagnosis not present

## 2015-11-28 DIAGNOSIS — Z09 Encounter for follow-up examination after completed treatment for conditions other than malignant neoplasm: Secondary | ICD-10-CM | POA: Diagnosis not present

## 2015-11-28 DIAGNOSIS — Z9889 Other specified postprocedural states: Secondary | ICD-10-CM | POA: Diagnosis not present

## 2015-11-28 DIAGNOSIS — Z86008 Personal history of in-situ neoplasm of other site: Secondary | ICD-10-CM | POA: Diagnosis not present

## 2016-02-03 DIAGNOSIS — H401123 Primary open-angle glaucoma, left eye, severe stage: Secondary | ICD-10-CM | POA: Diagnosis not present

## 2016-02-03 DIAGNOSIS — D3131 Benign neoplasm of right choroid: Secondary | ICD-10-CM | POA: Diagnosis not present

## 2016-02-03 DIAGNOSIS — H43813 Vitreous degeneration, bilateral: Secondary | ICD-10-CM | POA: Diagnosis not present

## 2016-10-27 DIAGNOSIS — I081 Rheumatic disorders of both mitral and tricuspid valves: Secondary | ICD-10-CM | POA: Diagnosis not present

## 2016-10-27 DIAGNOSIS — R918 Other nonspecific abnormal finding of lung field: Secondary | ICD-10-CM | POA: Diagnosis not present

## 2016-10-27 DIAGNOSIS — R400 Somnolence: Secondary | ICD-10-CM | POA: Diagnosis not present

## 2016-10-27 DIAGNOSIS — F0280 Dementia in other diseases classified elsewhere without behavioral disturbance: Secondary | ICD-10-CM | POA: Diagnosis not present

## 2016-10-27 DIAGNOSIS — R1110 Vomiting, unspecified: Secondary | ICD-10-CM | POA: Diagnosis not present

## 2016-10-27 DIAGNOSIS — K828 Other specified diseases of gallbladder: Secondary | ICD-10-CM | POA: Diagnosis not present

## 2016-10-27 DIAGNOSIS — E1139 Type 2 diabetes mellitus with other diabetic ophthalmic complication: Secondary | ICD-10-CM | POA: Diagnosis not present

## 2016-10-27 DIAGNOSIS — N400 Enlarged prostate without lower urinary tract symptoms: Secondary | ICD-10-CM | POA: Diagnosis not present

## 2016-10-27 DIAGNOSIS — R7989 Other specified abnormal findings of blood chemistry: Secondary | ICD-10-CM | POA: Diagnosis not present

## 2016-10-27 DIAGNOSIS — R531 Weakness: Secondary | ICD-10-CM | POA: Diagnosis not present

## 2016-10-27 DIAGNOSIS — R197 Diarrhea, unspecified: Secondary | ICD-10-CM | POA: Diagnosis not present

## 2016-10-27 DIAGNOSIS — R109 Unspecified abdominal pain: Secondary | ICD-10-CM | POA: Diagnosis not present

## 2016-10-27 DIAGNOSIS — J9811 Atelectasis: Secondary | ICD-10-CM | POA: Diagnosis not present

## 2016-10-27 DIAGNOSIS — H409 Unspecified glaucoma: Secondary | ICD-10-CM | POA: Diagnosis not present

## 2016-10-27 DIAGNOSIS — G9340 Encephalopathy, unspecified: Secondary | ICD-10-CM | POA: Diagnosis not present

## 2016-10-27 DIAGNOSIS — A419 Sepsis, unspecified organism: Secondary | ICD-10-CM | POA: Diagnosis not present

## 2016-10-27 DIAGNOSIS — R748 Abnormal levels of other serum enzymes: Secondary | ICD-10-CM | POA: Diagnosis not present

## 2016-10-27 DIAGNOSIS — I517 Cardiomegaly: Secondary | ICD-10-CM | POA: Diagnosis not present

## 2016-10-27 DIAGNOSIS — Z66 Do not resuscitate: Secondary | ICD-10-CM | POA: Diagnosis not present

## 2016-10-27 DIAGNOSIS — R739 Hyperglycemia, unspecified: Secondary | ICD-10-CM | POA: Diagnosis not present

## 2016-10-27 DIAGNOSIS — Z794 Long term (current) use of insulin: Secondary | ICD-10-CM | POA: Diagnosis not present

## 2016-10-27 DIAGNOSIS — R6520 Severe sepsis without septic shock: Secondary | ICD-10-CM | POA: Diagnosis not present

## 2016-10-27 DIAGNOSIS — D72829 Elevated white blood cell count, unspecified: Secondary | ICD-10-CM | POA: Diagnosis not present

## 2016-10-27 DIAGNOSIS — E1165 Type 2 diabetes mellitus with hyperglycemia: Secondary | ICD-10-CM | POA: Diagnosis not present

## 2016-10-27 DIAGNOSIS — G20 Parkinson's disease: Secondary | ICD-10-CM | POA: Diagnosis not present

## 2016-10-27 DIAGNOSIS — R1013 Epigastric pain: Secondary | ICD-10-CM | POA: Diagnosis not present

## 2016-10-27 DIAGNOSIS — E785 Hyperlipidemia, unspecified: Secondary | ICD-10-CM | POA: Diagnosis not present

## 2016-10-27 DIAGNOSIS — R5383 Other fatigue: Secondary | ICD-10-CM | POA: Diagnosis not present

## 2016-10-27 DIAGNOSIS — K219 Gastro-esophageal reflux disease without esophagitis: Secondary | ICD-10-CM | POA: Diagnosis not present

## 2016-10-27 DIAGNOSIS — K810 Acute cholecystitis: Secondary | ICD-10-CM | POA: Diagnosis not present

## 2016-11-04 DIAGNOSIS — E1165 Type 2 diabetes mellitus with hyperglycemia: Secondary | ICD-10-CM | POA: Diagnosis not present

## 2016-11-04 DIAGNOSIS — G20 Parkinson's disease: Secondary | ICD-10-CM | POA: Diagnosis not present

## 2016-11-04 DIAGNOSIS — K219 Gastro-esophageal reflux disease without esophagitis: Secondary | ICD-10-CM | POA: Diagnosis not present

## 2016-11-04 DIAGNOSIS — I129 Hypertensive chronic kidney disease with stage 1 through stage 4 chronic kidney disease, or unspecified chronic kidney disease: Secondary | ICD-10-CM | POA: Diagnosis not present

## 2016-11-04 DIAGNOSIS — H409 Unspecified glaucoma: Secondary | ICD-10-CM | POA: Diagnosis not present

## 2016-11-04 DIAGNOSIS — E1122 Type 2 diabetes mellitus with diabetic chronic kidney disease: Secondary | ICD-10-CM | POA: Diagnosis not present

## 2016-11-04 DIAGNOSIS — K810 Acute cholecystitis: Secondary | ICD-10-CM | POA: Diagnosis not present

## 2016-11-04 DIAGNOSIS — N189 Chronic kidney disease, unspecified: Secondary | ICD-10-CM | POA: Diagnosis not present

## 2016-11-04 DIAGNOSIS — F0390 Unspecified dementia without behavioral disturbance: Secondary | ICD-10-CM | POA: Diagnosis not present

## 2016-11-06 DIAGNOSIS — E1165 Type 2 diabetes mellitus with hyperglycemia: Secondary | ICD-10-CM | POA: Diagnosis not present

## 2016-11-06 DIAGNOSIS — I129 Hypertensive chronic kidney disease with stage 1 through stage 4 chronic kidney disease, or unspecified chronic kidney disease: Secondary | ICD-10-CM | POA: Diagnosis not present

## 2016-11-06 DIAGNOSIS — K810 Acute cholecystitis: Secondary | ICD-10-CM | POA: Diagnosis not present

## 2016-11-06 DIAGNOSIS — E1122 Type 2 diabetes mellitus with diabetic chronic kidney disease: Secondary | ICD-10-CM | POA: Diagnosis not present

## 2016-11-06 DIAGNOSIS — F0390 Unspecified dementia without behavioral disturbance: Secondary | ICD-10-CM | POA: Diagnosis not present

## 2016-11-06 DIAGNOSIS — G20 Parkinson's disease: Secondary | ICD-10-CM | POA: Diagnosis not present

## 2016-11-06 DIAGNOSIS — H409 Unspecified glaucoma: Secondary | ICD-10-CM | POA: Diagnosis not present

## 2016-11-06 DIAGNOSIS — K219 Gastro-esophageal reflux disease without esophagitis: Secondary | ICD-10-CM | POA: Diagnosis not present

## 2016-11-06 DIAGNOSIS — N189 Chronic kidney disease, unspecified: Secondary | ICD-10-CM | POA: Diagnosis not present

## 2016-11-07 DIAGNOSIS — I129 Hypertensive chronic kidney disease with stage 1 through stage 4 chronic kidney disease, or unspecified chronic kidney disease: Secondary | ICD-10-CM | POA: Diagnosis not present

## 2016-11-07 DIAGNOSIS — E1122 Type 2 diabetes mellitus with diabetic chronic kidney disease: Secondary | ICD-10-CM | POA: Diagnosis not present

## 2016-11-07 DIAGNOSIS — N189 Chronic kidney disease, unspecified: Secondary | ICD-10-CM | POA: Diagnosis not present

## 2016-11-07 DIAGNOSIS — K219 Gastro-esophageal reflux disease without esophagitis: Secondary | ICD-10-CM | POA: Diagnosis not present

## 2016-11-07 DIAGNOSIS — K810 Acute cholecystitis: Secondary | ICD-10-CM | POA: Diagnosis not present

## 2016-11-07 DIAGNOSIS — E1165 Type 2 diabetes mellitus with hyperglycemia: Secondary | ICD-10-CM | POA: Diagnosis not present

## 2016-11-07 DIAGNOSIS — H409 Unspecified glaucoma: Secondary | ICD-10-CM | POA: Diagnosis not present

## 2016-11-07 DIAGNOSIS — F0390 Unspecified dementia without behavioral disturbance: Secondary | ICD-10-CM | POA: Diagnosis not present

## 2016-11-07 DIAGNOSIS — G20 Parkinson's disease: Secondary | ICD-10-CM | POA: Diagnosis not present

## 2016-11-09 DIAGNOSIS — N189 Chronic kidney disease, unspecified: Secondary | ICD-10-CM | POA: Diagnosis not present

## 2016-11-09 DIAGNOSIS — H409 Unspecified glaucoma: Secondary | ICD-10-CM | POA: Diagnosis not present

## 2016-11-09 DIAGNOSIS — K219 Gastro-esophageal reflux disease without esophagitis: Secondary | ICD-10-CM | POA: Diagnosis not present

## 2016-11-09 DIAGNOSIS — I129 Hypertensive chronic kidney disease with stage 1 through stage 4 chronic kidney disease, or unspecified chronic kidney disease: Secondary | ICD-10-CM | POA: Diagnosis not present

## 2016-11-09 DIAGNOSIS — G20 Parkinson's disease: Secondary | ICD-10-CM | POA: Diagnosis not present

## 2016-11-09 DIAGNOSIS — F0390 Unspecified dementia without behavioral disturbance: Secondary | ICD-10-CM | POA: Diagnosis not present

## 2016-11-09 DIAGNOSIS — K810 Acute cholecystitis: Secondary | ICD-10-CM | POA: Diagnosis not present

## 2016-11-09 DIAGNOSIS — E1165 Type 2 diabetes mellitus with hyperglycemia: Secondary | ICD-10-CM | POA: Diagnosis not present

## 2016-11-09 DIAGNOSIS — E1122 Type 2 diabetes mellitus with diabetic chronic kidney disease: Secondary | ICD-10-CM | POA: Diagnosis not present

## 2016-11-10 DIAGNOSIS — F0390 Unspecified dementia without behavioral disturbance: Secondary | ICD-10-CM | POA: Diagnosis not present

## 2016-11-10 DIAGNOSIS — G20 Parkinson's disease: Secondary | ICD-10-CM | POA: Diagnosis not present

## 2016-11-10 DIAGNOSIS — K219 Gastro-esophageal reflux disease without esophagitis: Secondary | ICD-10-CM | POA: Diagnosis not present

## 2016-11-10 DIAGNOSIS — E1122 Type 2 diabetes mellitus with diabetic chronic kidney disease: Secondary | ICD-10-CM | POA: Diagnosis not present

## 2016-11-10 DIAGNOSIS — K819 Cholecystitis, unspecified: Secondary | ICD-10-CM | POA: Diagnosis not present

## 2016-11-10 DIAGNOSIS — K810 Acute cholecystitis: Secondary | ICD-10-CM | POA: Diagnosis not present

## 2016-11-10 DIAGNOSIS — N189 Chronic kidney disease, unspecified: Secondary | ICD-10-CM | POA: Diagnosis not present

## 2016-11-10 DIAGNOSIS — I129 Hypertensive chronic kidney disease with stage 1 through stage 4 chronic kidney disease, or unspecified chronic kidney disease: Secondary | ICD-10-CM | POA: Diagnosis not present

## 2016-11-10 DIAGNOSIS — H409 Unspecified glaucoma: Secondary | ICD-10-CM | POA: Diagnosis not present

## 2016-11-10 DIAGNOSIS — E1165 Type 2 diabetes mellitus with hyperglycemia: Secondary | ICD-10-CM | POA: Diagnosis not present

## 2016-11-11 DIAGNOSIS — F0390 Unspecified dementia without behavioral disturbance: Secondary | ICD-10-CM | POA: Diagnosis not present

## 2016-11-11 DIAGNOSIS — K810 Acute cholecystitis: Secondary | ICD-10-CM | POA: Diagnosis not present

## 2016-11-11 DIAGNOSIS — E1122 Type 2 diabetes mellitus with diabetic chronic kidney disease: Secondary | ICD-10-CM | POA: Diagnosis not present

## 2016-11-11 DIAGNOSIS — K219 Gastro-esophageal reflux disease without esophagitis: Secondary | ICD-10-CM | POA: Diagnosis not present

## 2016-11-11 DIAGNOSIS — I129 Hypertensive chronic kidney disease with stage 1 through stage 4 chronic kidney disease, or unspecified chronic kidney disease: Secondary | ICD-10-CM | POA: Diagnosis not present

## 2016-11-11 DIAGNOSIS — N189 Chronic kidney disease, unspecified: Secondary | ICD-10-CM | POA: Diagnosis not present

## 2016-11-11 DIAGNOSIS — G20 Parkinson's disease: Secondary | ICD-10-CM | POA: Diagnosis not present

## 2016-11-11 DIAGNOSIS — E1165 Type 2 diabetes mellitus with hyperglycemia: Secondary | ICD-10-CM | POA: Diagnosis not present

## 2016-11-11 DIAGNOSIS — H409 Unspecified glaucoma: Secondary | ICD-10-CM | POA: Diagnosis not present

## 2016-11-12 DIAGNOSIS — N189 Chronic kidney disease, unspecified: Secondary | ICD-10-CM | POA: Diagnosis not present

## 2016-11-12 DIAGNOSIS — I129 Hypertensive chronic kidney disease with stage 1 through stage 4 chronic kidney disease, or unspecified chronic kidney disease: Secondary | ICD-10-CM | POA: Diagnosis not present

## 2016-11-12 DIAGNOSIS — E1165 Type 2 diabetes mellitus with hyperglycemia: Secondary | ICD-10-CM | POA: Diagnosis not present

## 2016-11-12 DIAGNOSIS — K810 Acute cholecystitis: Secondary | ICD-10-CM | POA: Diagnosis not present

## 2016-11-12 DIAGNOSIS — E1122 Type 2 diabetes mellitus with diabetic chronic kidney disease: Secondary | ICD-10-CM | POA: Diagnosis not present

## 2016-11-12 DIAGNOSIS — G20 Parkinson's disease: Secondary | ICD-10-CM | POA: Diagnosis not present

## 2016-11-12 DIAGNOSIS — F0390 Unspecified dementia without behavioral disturbance: Secondary | ICD-10-CM | POA: Diagnosis not present

## 2016-11-12 DIAGNOSIS — H409 Unspecified glaucoma: Secondary | ICD-10-CM | POA: Diagnosis not present

## 2016-11-12 DIAGNOSIS — K219 Gastro-esophageal reflux disease without esophagitis: Secondary | ICD-10-CM | POA: Diagnosis not present

## 2016-11-13 DIAGNOSIS — K819 Cholecystitis, unspecified: Secondary | ICD-10-CM | POA: Diagnosis not present

## 2016-11-18 DIAGNOSIS — E1122 Type 2 diabetes mellitus with diabetic chronic kidney disease: Secondary | ICD-10-CM | POA: Diagnosis not present

## 2016-11-18 DIAGNOSIS — N189 Chronic kidney disease, unspecified: Secondary | ICD-10-CM | POA: Diagnosis not present

## 2016-11-18 DIAGNOSIS — G20 Parkinson's disease: Secondary | ICD-10-CM | POA: Diagnosis not present

## 2016-11-18 DIAGNOSIS — I129 Hypertensive chronic kidney disease with stage 1 through stage 4 chronic kidney disease, or unspecified chronic kidney disease: Secondary | ICD-10-CM | POA: Diagnosis not present

## 2016-11-18 DIAGNOSIS — E1165 Type 2 diabetes mellitus with hyperglycemia: Secondary | ICD-10-CM | POA: Diagnosis not present

## 2016-11-18 DIAGNOSIS — H409 Unspecified glaucoma: Secondary | ICD-10-CM | POA: Diagnosis not present

## 2016-11-18 DIAGNOSIS — K810 Acute cholecystitis: Secondary | ICD-10-CM | POA: Diagnosis not present

## 2016-11-18 DIAGNOSIS — F0390 Unspecified dementia without behavioral disturbance: Secondary | ICD-10-CM | POA: Diagnosis not present

## 2016-11-18 DIAGNOSIS — K219 Gastro-esophageal reflux disease without esophagitis: Secondary | ICD-10-CM | POA: Diagnosis not present

## 2016-11-19 DIAGNOSIS — H409 Unspecified glaucoma: Secondary | ICD-10-CM | POA: Diagnosis not present

## 2016-11-19 DIAGNOSIS — N189 Chronic kidney disease, unspecified: Secondary | ICD-10-CM | POA: Diagnosis not present

## 2016-11-19 DIAGNOSIS — G20 Parkinson's disease: Secondary | ICD-10-CM | POA: Diagnosis not present

## 2016-11-19 DIAGNOSIS — I129 Hypertensive chronic kidney disease with stage 1 through stage 4 chronic kidney disease, or unspecified chronic kidney disease: Secondary | ICD-10-CM | POA: Diagnosis not present

## 2016-11-19 DIAGNOSIS — K810 Acute cholecystitis: Secondary | ICD-10-CM | POA: Diagnosis not present

## 2016-11-19 DIAGNOSIS — E1122 Type 2 diabetes mellitus with diabetic chronic kidney disease: Secondary | ICD-10-CM | POA: Diagnosis not present

## 2016-11-19 DIAGNOSIS — F0390 Unspecified dementia without behavioral disturbance: Secondary | ICD-10-CM | POA: Diagnosis not present

## 2016-11-19 DIAGNOSIS — K219 Gastro-esophageal reflux disease without esophagitis: Secondary | ICD-10-CM | POA: Diagnosis not present

## 2016-11-19 DIAGNOSIS — E1165 Type 2 diabetes mellitus with hyperglycemia: Secondary | ICD-10-CM | POA: Diagnosis not present

## 2016-11-20 DIAGNOSIS — H409 Unspecified glaucoma: Secondary | ICD-10-CM | POA: Diagnosis not present

## 2016-11-20 DIAGNOSIS — F0390 Unspecified dementia without behavioral disturbance: Secondary | ICD-10-CM | POA: Diagnosis not present

## 2016-11-20 DIAGNOSIS — E1165 Type 2 diabetes mellitus with hyperglycemia: Secondary | ICD-10-CM | POA: Diagnosis not present

## 2016-11-20 DIAGNOSIS — K810 Acute cholecystitis: Secondary | ICD-10-CM | POA: Diagnosis not present

## 2016-11-20 DIAGNOSIS — G20 Parkinson's disease: Secondary | ICD-10-CM | POA: Diagnosis not present

## 2016-11-20 DIAGNOSIS — E1122 Type 2 diabetes mellitus with diabetic chronic kidney disease: Secondary | ICD-10-CM | POA: Diagnosis not present

## 2016-11-20 DIAGNOSIS — I129 Hypertensive chronic kidney disease with stage 1 through stage 4 chronic kidney disease, or unspecified chronic kidney disease: Secondary | ICD-10-CM | POA: Diagnosis not present

## 2016-11-20 DIAGNOSIS — N189 Chronic kidney disease, unspecified: Secondary | ICD-10-CM | POA: Diagnosis not present

## 2016-11-20 DIAGNOSIS — K219 Gastro-esophageal reflux disease without esophagitis: Secondary | ICD-10-CM | POA: Diagnosis not present

## 2016-11-24 DIAGNOSIS — K819 Cholecystitis, unspecified: Secondary | ICD-10-CM | POA: Diagnosis not present

## 2016-12-01 DIAGNOSIS — K810 Acute cholecystitis: Secondary | ICD-10-CM | POA: Diagnosis not present

## 2016-12-01 DIAGNOSIS — H409 Unspecified glaucoma: Secondary | ICD-10-CM | POA: Diagnosis not present

## 2016-12-01 DIAGNOSIS — E1122 Type 2 diabetes mellitus with diabetic chronic kidney disease: Secondary | ICD-10-CM | POA: Diagnosis not present

## 2016-12-01 DIAGNOSIS — E1165 Type 2 diabetes mellitus with hyperglycemia: Secondary | ICD-10-CM | POA: Diagnosis not present

## 2016-12-01 DIAGNOSIS — F0390 Unspecified dementia without behavioral disturbance: Secondary | ICD-10-CM | POA: Diagnosis not present

## 2016-12-01 DIAGNOSIS — K219 Gastro-esophageal reflux disease without esophagitis: Secondary | ICD-10-CM | POA: Diagnosis not present

## 2016-12-01 DIAGNOSIS — G20 Parkinson's disease: Secondary | ICD-10-CM | POA: Diagnosis not present

## 2016-12-01 DIAGNOSIS — I129 Hypertensive chronic kidney disease with stage 1 through stage 4 chronic kidney disease, or unspecified chronic kidney disease: Secondary | ICD-10-CM | POA: Diagnosis not present

## 2016-12-01 DIAGNOSIS — N189 Chronic kidney disease, unspecified: Secondary | ICD-10-CM | POA: Diagnosis not present

## 2016-12-10 DIAGNOSIS — K819 Cholecystitis, unspecified: Secondary | ICD-10-CM | POA: Diagnosis not present

## 2016-12-15 DIAGNOSIS — G20 Parkinson's disease: Secondary | ICD-10-CM | POA: Diagnosis not present

## 2016-12-15 DIAGNOSIS — E1122 Type 2 diabetes mellitus with diabetic chronic kidney disease: Secondary | ICD-10-CM | POA: Diagnosis not present

## 2016-12-15 DIAGNOSIS — N189 Chronic kidney disease, unspecified: Secondary | ICD-10-CM | POA: Diagnosis not present

## 2016-12-15 DIAGNOSIS — H409 Unspecified glaucoma: Secondary | ICD-10-CM | POA: Diagnosis not present

## 2016-12-15 DIAGNOSIS — K219 Gastro-esophageal reflux disease without esophagitis: Secondary | ICD-10-CM | POA: Diagnosis not present

## 2016-12-15 DIAGNOSIS — I129 Hypertensive chronic kidney disease with stage 1 through stage 4 chronic kidney disease, or unspecified chronic kidney disease: Secondary | ICD-10-CM | POA: Diagnosis not present

## 2016-12-15 DIAGNOSIS — K810 Acute cholecystitis: Secondary | ICD-10-CM | POA: Diagnosis not present

## 2016-12-15 DIAGNOSIS — F0390 Unspecified dementia without behavioral disturbance: Secondary | ICD-10-CM | POA: Diagnosis not present

## 2016-12-15 DIAGNOSIS — E1165 Type 2 diabetes mellitus with hyperglycemia: Secondary | ICD-10-CM | POA: Diagnosis not present

## 2017-05-03 DIAGNOSIS — G20 Parkinson's disease: Secondary | ICD-10-CM | POA: Diagnosis not present

## 2017-05-03 DIAGNOSIS — K810 Acute cholecystitis: Secondary | ICD-10-CM | POA: Diagnosis not present

## 2017-12-17 ENCOUNTER — Telehealth: Payer: Self-pay | Admitting: Gastroenterology

## 2017-12-29 NOTE — Telephone Encounter (Signed)
Yes it is on Dr. Leland Her desk, he has not reviewed them yet.

## 2017-12-29 NOTE — Telephone Encounter (Signed)
Would one of you check and see if there is referral documentation from the [place] for this patient.  They called again asking about scheduling.  Thanks.

## 2017-12-30 NOTE — Telephone Encounter (Signed)
Dr.Gupta reviewed records states it is okay to schedule an OV with him. Left message for patient to call back and schedule an appointment.

## 2018-01-06 NOTE — Telephone Encounter (Signed)
Patient wife calling back. She states that the pt has an appointment in [place] but does not know if it is GI or something else. I advised her to check and verify that it was not GI before we scheduled.

## 2018-01-12 ENCOUNTER — Encounter: Payer: Self-pay | Admitting: Gastroenterology

## 2018-02-08 ENCOUNTER — Encounter: Payer: Self-pay | Admitting: Gastroenterology

## 2018-02-08 ENCOUNTER — Ambulatory Visit (INDEPENDENT_AMBULATORY_CARE_PROVIDER_SITE_OTHER): Payer: Non-veteran care | Admitting: Gastroenterology

## 2018-02-08 VITALS — BP 110/70 | HR 71 | Ht 66.0 in | Wt 123.0 lb

## 2018-02-08 DIAGNOSIS — R1310 Dysphagia, unspecified: Secondary | ICD-10-CM

## 2018-02-08 DIAGNOSIS — K219 Gastro-esophageal reflux disease without esophagitis: Secondary | ICD-10-CM

## 2018-02-08 DIAGNOSIS — Z8669 Personal history of other diseases of the nervous system and sense organs: Secondary | ICD-10-CM

## 2018-02-08 MED ORDER — SOD PICOSULFATE-MAG OX-CIT ACD 10-3.5-12 MG-GM -GM/160ML PO SOLN: 1.0000 | Freq: Once | ORAL | 0 refills | Status: AC

## 2018-02-08 NOTE — Progress Notes (Signed)
Chief Complaint: Esophageal dysphagia and history of polyps  Referring Provider:  Antony Blackbird, MD      ASSESSMENT AND PLAN;   #1. GERD with esophageal dysphagia (s/p dilatation in past).  Likely with element of oropharyngeal dysphagia due to Parkinson's.  #2. H/O Polyps (s/p colon at [facility])  Plan: - Ba swallow with tablet. - Proceed with EGD with dil and colonoscopy.  I have discussed the risks and benefits.  The risks including risk of perforation requiring laparotomy, bleeding after biopsies/polypectomy requiring blood transfusions and risks of anesthesia/sedation were discussed.  Rare risks of missing UGI and colorectal neoplasms were also discussed.  Alternatives were also given.  Patient is fully aware and agrees to proceed. All the questions were answered. Procedures will be scheduled in upcoming days.  Patient is to report immediately if there is any significant weight loss or excessive bleeding until then. Consent forms were given for review. - Continue omeprazole 20mg  po qd to continue. - I have instructed patient that she needs to chew foods especially meats and breads well and eat slowly. - If with continued problems with oropharyngeal dysphagia, will consider modified barium swallow possibly followed by speech consultation.    HPI:    Stephen Frost is a 77 y.o. male  Here for EGD with dil and colon as advised by [place]. We do not have any records at the present time Per patient's wife -he had history of colonic polyps on colonoscopy over 5 years ago at the [place] in [place].  He has been advised to get a repeat colonoscopy performed. Having problems swallowing mostly solids-intermittent, gets hung up in the upper chest/lower neck, Parkinson's is getting worse.  No problems taking pills Has history of heartburn in the past which is better with omeprazole Has been getting weak Can only walk few steps due to Parkinson's getting worse. No fever or chills No  significant constipation   Past Medical History:  Diagnosis Date  . Acid reflux   . Arthritis    in spine  . Diabetes mellitus without complication ([place])    type 2  . Melanoma [facility])     Past Surgical History:  Procedure Laterality Date  . CATARACT EXTRACTION, BILATERAL    . EYE SURGERY    . GLAUCOMA SURGERY    . HERNIA REPAIR     double  . TONSILLECTOMY      Family History  Problem Relation Age of Onset  . Alcoholism Mother   . Heart attack Father   . Colon cancer Neg Hx   . Esophageal cancer Neg Hx     Social History   Tobacco Use  . Smoking status: Never Smoker  . Smokeless tobacco: Never Used  Substance Use Topics  . Alcohol use: No  . Drug use: No    Current Outpatient Medications  Medication Sig Dispense Refill  . AMBULATORY NON FORMULARY MEDICATION 1 drop 3 (three) times daily. Symbrinza eye drops    . atorvastatin (LIPITOR) 40 MG tablet Take 40 mg by mouth daily.    . brimonidine (ALPHAGAN P) 0.1 % SOLN 1 drop daily.    . carbidopa-levodopa (SINEMET IR) 10-100 MG tablet Take 1 tablet by mouth 3 (three) times daily.    Marland Kitchen gabapentin (NEURONTIN) 800 MG tablet Take 800 mg by mouth 3 (three) times daily.    Marland Kitchen glucose 4 GM chewable tablet Chew 1 tablet by mouth as needed for low blood sugar.    . insulin glargine (LANTUS)  100 UNIT/ML injection Inject 0.3 mLs (30 Units total) into the skin daily. 10 mL 11  . latanoprost (XALATAN) 0.005 % ophthalmic solution 1 drop at bedtime.    Marland Kitchen losartan (COZAAR) 50 MG tablet Take 25 mg by mouth daily.    . metformin (FORTAMET) 500 MG (OSM) 24 hr tablet Take 500 mg by mouth daily with breakfast.    . omeprazole (PRILOSEC) 20 MG capsule Take 20 mg by mouth daily.    Marland Kitchen oxybutynin (DITROPAN) 5 MG tablet Take 5 mg by mouth daily.    Marland Kitchen SEMAGLUTIDE, 1 MG/DOSE, [place] Inject 1 Units into the skin once a week.    . timolol (TIMOPTIC) 0.5 % ophthalmic solution 1 drop 2 (two) times daily.     No current facility-administered medications  for this visit.     Allergies  Allergen Reactions  . Insulin Lispro Other (See Comments)  . Metformin And Related Diarrhea    Review of Systems:  Constitutional: Denies fever, chills, diaphoresis, appetite change and fatigue.  HEENT: Denies photophobia, eye pain, redness, hearing loss, ear pain, congestion, sore throat, rhinorrhea, sneezing, mouth sores, neck pain, neck stiffness and tinnitus.   Respiratory: Denies SOB, DOE, cough, chest tightness,  and wheezing.   Cardiovascular: Denies chest pain, palpitations and leg swelling.  Genitourinary: Denies dysuria, urgency, frequency, hematuria, flank pain and difficulty urinating.  Musculoskeletal: Has myalgias, back pain, joint swelling, arthralgias and gait problem.  Skin: No rash.  Neurological: Denies dizziness, seizures, syncope, weakness, light-headedness, numbness and headaches. Has Parkinson's. Hematological: Denies adenopathy. Easy bruising, personal or family bleeding history  Psychiatric/Behavioral: No anxiety or depression     Physical Exam:    BP 110/70   Pulse 71   Ht 5\' 6"  (1.676 m)   Wt 123 lb (55.8 kg)   BMI 19.85 kg/m  Filed Weights   02/08/18 1439  Weight: 123 lb (55.8 kg)   Constitutional:  Well-developed, in no acute distress. Psychiatric: Flat mood and affect. Behavior is normal. HEENT: Pupils normal.  Conjunctivae are normal. No scleral icterus. Neck supple.  Cardiovascular: Normal rate, regular rhythm. No edema Pulmonary/chest: Effort normal and breath sounds normal. No wheezing, rales or rhonchi. Abdominal: Soft, nondistended. Nontender. Bowel sounds active throughout. There are no masses palpable. No hepatomegaly. Rectal:  defered Neurological: Alert and oriented to person place and time. Skin: Skin is warm and dry. No rashes noted.   Carmell Austria, MD 02/08/2018, 3:07 PM  Cc: Antony Blackbird, MD

## 2018-02-08 NOTE — Patient Instructions (Signed)
If you are age 75 or older, your body mass index should be between 23-30. Your Body mass index is 19.85 kg/m. If this is out of the aforementioned range listed, please consider follow up with your Primary Care Provider.  If you are age 93 or younger, your body mass index should be between 19-25. Your Body mass index is 19.85 kg/m. If this is out of the aformentioned range listed, please consider follow up with your Primary Care Provider.   Two days before your procedure: Mix 3 packs (or capfuls) of Miralax in 48 ounces of clear liquid and drink at 6pm.   You have been scheduled for a Barium Esophogram at [facility] (1st floor of the hospital) on 02/14/18 at 11am. Please arrive 15 minutes prior to your appointment for registration. Make certain not to have anything to eat or drink 3 hours prior to your test. If you need to reschedule for any reason, please contact radiology at [phone] to do so. __________________________________________________________________ A barium swallow is an examination that concentrates on views of the esophagus. This tends to be a double contrast exam (barium and two liquids which, when combined, create a gas to distend the wall of the oesophagus) or single contrast (non-ionic iodine based). The study is usually tailored to your symptoms so a good history is essential. Attention is paid during the study to the form, structure and configuration of the esophagus, looking for functional disorders (such as aspiration, dysphagia, achalasia, motility and reflux) EXAMINATION You may be asked to change into a gown, depending on the type of swallow being performed. A radiologist and radiographer will perform the procedure. The radiologist will advise you of the type of contrast selected for your procedure and direct you during the exam. You will be asked to stand, sit or lie in several different positions and to hold a small amount of fluid in your mouth before being  asked to swallow while the imaging is performed .In some instances you may be asked to swallow barium coated marshmallows to assess the motility of a solid food bolus. The exam can be recorded as a digital or video fluoroscopy procedure. POST PROCEDURE It will take 1-2 days for the barium to pass through your system. To facilitate this, it is important, unless otherwise directed, to increase your fluids for the next 24-48hrs and to resume your normal diet.  This test typically takes about 30 minutes to perform. _______________________________________________________________________________  Thank you,  Dr. Jackquline Denmark ___

## 2018-02-09 NOTE — Progress Notes (Signed)
Can we figure out pt's PCP and forward the note. See Dr Siri Cole note

## 2018-02-10 NOTE — Progress Notes (Addendum)
Note sent to Dr. Chapman Fitch and Pts PCP as listed in epic.  Dr. Donita Brooks

## 2018-02-14 ENCOUNTER — Other Ambulatory Visit (HOSPITAL_COMMUNITY): Payer: Non-veteran care

## 2018-02-15 ENCOUNTER — Ambulatory Visit (HOSPITAL_COMMUNITY)
Admission: RE | Admit: 2018-02-15 | Discharge: 2018-02-15 | Disposition: A | Discharge status: Home / Self Care | Payer: No Typology Code available for payment source | Source: Ambulatory Visit | Attending: Gastroenterology | Admitting: Gastroenterology

## 2018-02-15 DIAGNOSIS — Z8669 Personal history of other diseases of the nervous system and sense organs: Secondary | ICD-10-CM | POA: Insufficient documentation

## 2018-02-15 DIAGNOSIS — K225 Diverticulum of esophagus, acquired: Secondary | ICD-10-CM | POA: Insufficient documentation

## 2018-02-15 DIAGNOSIS — K219 Gastro-esophageal reflux disease without esophagitis: Secondary | ICD-10-CM | POA: Insufficient documentation

## 2018-02-15 DIAGNOSIS — R918 Other nonspecific abnormal finding of lung field: Secondary | ICD-10-CM | POA: Diagnosis not present

## 2018-02-15 DIAGNOSIS — R1310 Dysphagia, unspecified: Secondary | ICD-10-CM | POA: Diagnosis present

## 2018-02-16 ENCOUNTER — Other Ambulatory Visit: Payer: Self-pay

## 2018-02-16 DIAGNOSIS — R1310 Dysphagia, unspecified: Secondary | ICD-10-CM

## 2018-02-18 ENCOUNTER — Other Ambulatory Visit (HOSPITAL_COMMUNITY): Payer: Self-pay | Admitting: Gastroenterology

## 2018-02-18 DIAGNOSIS — R1310 Dysphagia, unspecified: Secondary | ICD-10-CM

## 2018-02-23 ENCOUNTER — Ambulatory Visit (HOSPITAL_COMMUNITY)
Admission: RE | Admit: 2018-02-23 | Discharge: 2018-02-23 | Disposition: A | Discharge status: Home / Self Care | Payer: No Typology Code available for payment source | Source: Ambulatory Visit | Attending: Gastroenterology | Admitting: Gastroenterology

## 2018-02-23 DIAGNOSIS — G20 Parkinson's disease: Secondary | ICD-10-CM | POA: Diagnosis not present

## 2018-02-23 DIAGNOSIS — E119 Type 2 diabetes mellitus without complications: Secondary | ICD-10-CM | POA: Insufficient documentation

## 2018-02-23 DIAGNOSIS — M1990 Unspecified osteoarthritis, unspecified site: Secondary | ICD-10-CM | POA: Insufficient documentation

## 2018-02-23 DIAGNOSIS — R1312 Dysphagia, oropharyngeal phase: Secondary | ICD-10-CM | POA: Insufficient documentation

## 2018-02-23 DIAGNOSIS — E7800 Pure hypercholesterolemia, unspecified: Secondary | ICD-10-CM | POA: Insufficient documentation

## 2018-02-23 DIAGNOSIS — Z85820 Personal history of malignant melanoma of skin: Secondary | ICD-10-CM | POA: Insufficient documentation

## 2018-02-23 DIAGNOSIS — K219 Gastro-esophageal reflux disease without esophagitis: Secondary | ICD-10-CM | POA: Insufficient documentation

## 2018-02-23 DIAGNOSIS — R1310 Dysphagia, unspecified: Secondary | ICD-10-CM

## 2018-02-23 NOTE — Evaluation (Signed)
Modified Barium Swallow Progress Note  Patient Details  Name: Stephen Frost MRN: [id number] Date of Birth: 05/23/1940  Today's Date: 02/23/2018  Modified Barium Swallow completed.  Full report located under Chart Review in the Imaging Section.  Brief recommendations include the following:  Clinical Impression  Pt presents with moderate sensorimotor oropharyngeal dysphagia characterized by premature spillage to the pyriforms and a delayed swallowing trigger resulting in silent aspiration of thin liquids. Chin tuck was not effective in decreasing or eliminating aspiration. Note two episodes of silent aspiration of NTL however one episode was immediately following thin presentation and suspect aspiration was related to thin residue left in the vestibule and the second episode was with administration of the pill. No aspiration of NTL was noted with any other trials. Slightly delayed swallowing trigger with solid textures and mild pharyngeal residue with no penetration/aspiration. Large Zenker's Diverticulum (previously noted on esophagram) results in occasional retrograde flow of thin liquids pooling into the pyriforms. Recommend continue with regular diet and downgrade to NTL; meds to be crushed in puree. SLP provided education and handout to wife present for the study regarding thickened liquids.  Pt would benefit from outpatient speech therapy to target dysphagia and voice.    Swallow Evaluation Recommendations       SLP Diet Recommendations: Regular solids;Nectar thick liquid   Liquid Administration via: Straw;Cup   Medication Administration: Crushed with puree       Compensations: Small sips/bites;Clear throat intermittently   Postural Changes: Remain semi-upright after after feeds/meals (Comment);Seated upright at 90 degrees   Oral Care Recommendations: Oral care BID   Other Recommendations: Order thickener from [place]. Roddie Mc, [place] Speech Language  Pathologist   Wende Bushy 02/23/2018,4:05 PM

## 2018-04-06 ENCOUNTER — Encounter: Payer: Self-pay | Admitting: Gastroenterology

## 2018-04-06 ENCOUNTER — Encounter: Payer: Non-veteran care | Admitting: Gastroenterology

## 2018-04-06 ENCOUNTER — Telehealth: Payer: Self-pay | Admitting: Gastroenterology

## 2018-04-06 ENCOUNTER — Ambulatory Visit (AMBULATORY_SURGERY_CENTER): Payer: No Typology Code available for payment source | Admitting: Gastroenterology

## 2018-04-06 VITALS — BP 178/92 | HR 82 | Temp 98.4°F | Resp 22 | Ht 66.0 in | Wt 123.0 lb

## 2018-04-06 DIAGNOSIS — K449 Diaphragmatic hernia without obstruction or gangrene: Secondary | ICD-10-CM

## 2018-04-06 DIAGNOSIS — R1310 Dysphagia, unspecified: Secondary | ICD-10-CM

## 2018-04-06 DIAGNOSIS — K5710 Diverticulosis of small intestine without perforation or abscess without bleeding: Secondary | ICD-10-CM

## 2018-04-06 DIAGNOSIS — K3189 Other diseases of stomach and duodenum: Secondary | ICD-10-CM | POA: Diagnosis not present

## 2018-04-06 MED ORDER — SODIUM CHLORIDE 0.9 % IV SOLN: 500.0000 mL | Freq: Once | INTRAVENOUS | Status: DC

## 2018-04-06 NOTE — Op Note (Addendum)
[facility] Patient Name: Stephen Frost Procedure Date: 04/06/2018 9:30 AM MRN: [id number] Endoscopist: Jackquline Denmark , MD Age: 77 Referring MD:  Date of Birth: [birth year] Gender: Male Account #: [id number] Procedure:                Upper GI endoscopy Indications:              Dysphagia Medicines:                Monitored Anesthesia Care Procedure:                Pre-Anesthesia Assessment:                           - Prior to the procedure, a History and Physical                            was performed, and patient medications and                            allergies were reviewed. The patient's tolerance of                            previous anesthesia was also reviewed. The risks                            and benefits of the procedure and the sedation                            options and risks were discussed with the patient.                            All questions were answered, and informed consent                            was obtained. Prior Anticoagulants: The patient has                            taken no previous anticoagulant or antiplatelet                            agents. ASA Grade Assessment: III - A patient with                            severe systemic disease. After reviewing the risks                            and benefits, the patient was deemed in                            satisfactory condition to undergo the procedure.                           After obtaining informed consent, the endoscope was  passed under direct vision. Throughout the                            procedure, the patient's blood pressure, pulse, and                            oxygen saturations were monitored continuously. The                            Endoscope was introduced through the anus and                            advanced to the second part of duodenum. After                            obtaining informed consent, the endoscope was                       passed under direct vision. Throughout the                            procedure, the patient's blood pressure, pulse, and                            oxygen saturations were monitored continuously. The                            upper GI endoscopy was accomplished without                            difficulty. The patient tolerated the procedure                            well. Scope In: Scope Out: Findings:                 2 cm Zenker's diverticulum was noted in the                            posterior cervical esophagus. No narrowing of the                            cervical esophagus.                           The lower third of the esophagus was mildly                            tortuous. Biopsies were taken with a cold forceps                            for histology.                           A small hiatal hernia was present.  The exam was otherwise without abnormality. Complications:            No immediate complications. Estimated Blood Loss:     Estimated blood loss: none. Impression:               - Mildly tortuous esophagus s/p presbyesophagus.                           - Approximately 2 cm Zenker's diverticulum.                           - Small hiatal hernia.                           - The examination was otherwise normal. Recommendation:           - Resume previous diet.                           - Continue present medications.                           - Await pathology results.                           - Return to GI clinic in 12 weeks. If still with                            problems, would consider endoscopic                            diverticulectomy for Zenker's diverticulum. Jackquline Denmark, MD 04/06/2018 10:21:16 AM This report has been signed electronically.

## 2018-04-06 NOTE — Progress Notes (Signed)
Pt had solid stool when up to the bathroom.  He states he drank entire prep- when I asked his wife, she is unsure if stools have been clear or not.  Per Dr. Lyndel Safe, going to do the EGD only. Wife and patient made aware

## 2018-04-06 NOTE — Patient Instructions (Signed)
Continue present medications. Please follow-up with Dr. Lyndel Safe in GI clinic in 12 weeks.     YOU HAD AN ENDOSCOPIC PROCEDURE TODAY AT [facility]:   Refer to the procedure report that was given to you for any specific questions about what was found during the examination.  If the procedure report does not answer your questions, please call your gastroenterologist to clarify.  If you requested that your care partner not be given the details of your procedure findings, then the procedure report has been included in a sealed envelope for you to review at your convenience later.  YOU SHOULD EXPECT: Some feelings of bloating in the abdomen. Passage of more gas than usual.  Walking can help get rid of the air that was put into your GI tract during the procedure and reduce the bloating. If you had a lower endoscopy (such as a colonoscopy or flexible sigmoidoscopy) you may notice spotting of blood in your stool or on the toilet paper. If you underwent a bowel prep for your procedure, you may not have a normal bowel movement for a few days.  Please Note:  You might notice some irritation and congestion in your nose or some drainage.  This is from the oxygen used during your procedure.  There is no need for concern and it should clear up in a day or so.  SYMPTOMS TO REPORT IMMEDIATELY:     Following upper endoscopy (EGD)  Vomiting of blood or coffee ground material  New chest pain or pain under the shoulder blades  Painful or persistently difficult swallowing  New shortness of breath  Fever of 100F or higher  Black, tarry-looking stools  For urgent or emergent issues, a gastroenterologist can be reached at any hour by calling [phone].   DIET:  We do recommend a small meal at first, but then you may proceed to your regular diet.  Drink plenty of fluids but you should avoid alcoholic beverages for 24 hours.  ACTIVITY:  You should plan to take it easy for the rest of today  and you should NOT DRIVE or use heavy machinery until tomorrow (because of the sedation medicines used during the test).    FOLLOW UP: Our staff will call the number listed on your records the next business day following your procedure to check on you and address any questions or concerns that you may have regarding the information given to you following your procedure. If we do not reach you, we will leave a message.  However, if you are feeling well and you are not experiencing any problems, there is no need to return our call.  We will assume that you have returned to your regular daily activities without incident.  If any biopsies were taken you will be contacted by phone or by letter within the next 1-3 weeks.  Please call us at [phone] if you have not heard about the biopsies in 3 weeks.    SIGNATURES/CONFIDENTIALITY: You and/or your care partner have signed paperwork which will be entered into your electronic medical record.  These signatures attest to the fact that that the information above on your After Visit Summary has been reviewed and is understood.  Full responsibility of the confidentiality of this discharge information lies with you and/or your care-partner.

## 2018-04-06 NOTE — Progress Notes (Signed)
A/ox3 pleased with MAC, report to RN 

## 2018-04-06 NOTE — Progress Notes (Signed)
Called to room to assist during endoscopic procedure.  Patient ID and intended procedure confirmed with present staff. Received instructions for my participation in the procedure from the performing physician.  

## 2018-04-07 ENCOUNTER — Telehealth: Payer: Self-pay

## 2018-04-07 NOTE — Telephone Encounter (Signed)
Second post procedure phone call, no answer

## 2018-04-07 NOTE — Telephone Encounter (Signed)
Patient was worked into the schedule for an Endoscopy. Will plan on Colonoscopy next time.

## 2018-04-07 NOTE — Telephone Encounter (Signed)
First post procedure follow up call, no answer 

## 2018-04-12 ENCOUNTER — Encounter: Payer: Self-pay | Admitting: Gastroenterology

## 2018-05-20 ENCOUNTER — Emergency Department (HOSPITAL_COMMUNITY): Payer: Medicare HMO

## 2018-05-20 ENCOUNTER — Encounter (HOSPITAL_COMMUNITY): Payer: Self-pay

## 2018-05-20 ENCOUNTER — Other Ambulatory Visit: Payer: Self-pay

## 2018-05-20 ENCOUNTER — Inpatient Hospital Stay (HOSPITAL_COMMUNITY)
Admission: EM | Admit: 2018-05-20 | Discharge: 2018-06-01 | DRG: 682 | Disposition: A | Discharge status: Hospice | Payer: Medicare HMO | Attending: Internal Medicine | Admitting: Internal Medicine

## 2018-05-20 DIAGNOSIS — N179 Acute kidney failure, unspecified: Secondary | ICD-10-CM | POA: Diagnosis not present

## 2018-05-20 DIAGNOSIS — F0390 Unspecified dementia without behavioral disturbance: Secondary | ICD-10-CM | POA: Diagnosis not present

## 2018-05-20 DIAGNOSIS — R000 Tachycardia, unspecified: Secondary | ICD-10-CM | POA: Diagnosis not present

## 2018-05-20 DIAGNOSIS — G9340 Encephalopathy, unspecified: Secondary | ICD-10-CM | POA: Diagnosis not present

## 2018-05-20 DIAGNOSIS — E860 Dehydration: Secondary | ICD-10-CM | POA: Diagnosis present

## 2018-05-20 DIAGNOSIS — R531 Weakness: Secondary | ICD-10-CM | POA: Diagnosis not present

## 2018-05-20 DIAGNOSIS — W06XXXA Fall from bed, initial encounter: Secondary | ICD-10-CM | POA: Diagnosis present

## 2018-05-20 DIAGNOSIS — Z85820 Personal history of malignant melanoma of skin: Secondary | ICD-10-CM | POA: Diagnosis not present

## 2018-05-20 DIAGNOSIS — E785 Hyperlipidemia, unspecified: Secondary | ICD-10-CM | POA: Diagnosis present

## 2018-05-20 DIAGNOSIS — F0281 Dementia in other diseases classified elsewhere with behavioral disturbance: Secondary | ICD-10-CM | POA: Diagnosis present

## 2018-05-20 DIAGNOSIS — Z681 Body mass index (BMI) 19 or less, adult: Secondary | ICD-10-CM

## 2018-05-20 DIAGNOSIS — Y92003 Bedroom of unspecified non-institutional (private) residence as the place of occurrence of the external cause: Secondary | ICD-10-CM

## 2018-05-20 DIAGNOSIS — K219 Gastro-esophageal reflux disease without esophagitis: Secondary | ICD-10-CM | POA: Diagnosis present

## 2018-05-20 DIAGNOSIS — I10 Essential (primary) hypertension: Secondary | ICD-10-CM | POA: Diagnosis not present

## 2018-05-20 DIAGNOSIS — Z66 Do not resuscitate: Secondary | ICD-10-CM | POA: Diagnosis present

## 2018-05-20 DIAGNOSIS — E1122 Type 2 diabetes mellitus with diabetic chronic kidney disease: Secondary | ICD-10-CM | POA: Diagnosis present

## 2018-05-20 DIAGNOSIS — H9110 Presbycusis, unspecified ear: Secondary | ICD-10-CM | POA: Diagnosis present

## 2018-05-20 DIAGNOSIS — Z888 Allergy status to other drugs, medicaments and biological substances status: Secondary | ICD-10-CM

## 2018-05-20 DIAGNOSIS — E1129 Type 2 diabetes mellitus with other diabetic kidney complication: Secondary | ICD-10-CM | POA: Diagnosis not present

## 2018-05-20 DIAGNOSIS — Z515 Encounter for palliative care: Secondary | ICD-10-CM | POA: Diagnosis not present

## 2018-05-20 DIAGNOSIS — R0689 Other abnormalities of breathing: Secondary | ICD-10-CM | POA: Diagnosis not present

## 2018-05-20 DIAGNOSIS — E119 Type 2 diabetes mellitus without complications: Secondary | ICD-10-CM

## 2018-05-20 DIAGNOSIS — G9341 Metabolic encephalopathy: Secondary | ICD-10-CM | POA: Diagnosis present

## 2018-05-20 DIAGNOSIS — G20 Parkinson's disease: Secondary | ICD-10-CM | POA: Diagnosis not present

## 2018-05-20 DIAGNOSIS — E46 Unspecified protein-calorie malnutrition: Secondary | ICD-10-CM

## 2018-05-20 DIAGNOSIS — J690 Pneumonitis due to inhalation of food and vomit: Secondary | ICD-10-CM | POA: Diagnosis not present

## 2018-05-20 DIAGNOSIS — N390 Urinary tract infection, site not specified: Secondary | ICD-10-CM

## 2018-05-20 DIAGNOSIS — Z7189 Other specified counseling: Secondary | ICD-10-CM | POA: Diagnosis not present

## 2018-05-20 DIAGNOSIS — G20A1 Parkinson's disease without dyskinesia, without mention of fluctuations: Secondary | ICD-10-CM | POA: Diagnosis present

## 2018-05-20 DIAGNOSIS — R918 Other nonspecific abnormal finding of lung field: Secondary | ICD-10-CM | POA: Diagnosis not present

## 2018-05-20 DIAGNOSIS — Z794 Long term (current) use of insulin: Secondary | ICD-10-CM | POA: Diagnosis not present

## 2018-05-20 DIAGNOSIS — N3001 Acute cystitis with hematuria: Secondary | ICD-10-CM | POA: Diagnosis not present

## 2018-05-20 DIAGNOSIS — R1310 Dysphagia, unspecified: Secondary | ICD-10-CM | POA: Diagnosis present

## 2018-05-20 DIAGNOSIS — E1136 Type 2 diabetes mellitus with diabetic cataract: Secondary | ICD-10-CM | POA: Diagnosis present

## 2018-05-20 DIAGNOSIS — S299XXA Unspecified injury of thorax, initial encounter: Secondary | ICD-10-CM | POA: Diagnosis not present

## 2018-05-20 DIAGNOSIS — T17908A Unspecified foreign body in respiratory tract, part unspecified causing other injury, initial encounter: Secondary | ICD-10-CM

## 2018-05-20 DIAGNOSIS — R296 Repeated falls: Secondary | ICD-10-CM | POA: Diagnosis present

## 2018-05-20 DIAGNOSIS — E7800 Pure hypercholesterolemia, unspecified: Secondary | ICD-10-CM | POA: Diagnosis present

## 2018-05-20 DIAGNOSIS — S199XXA Unspecified injury of neck, initial encounter: Secondary | ICD-10-CM | POA: Diagnosis not present

## 2018-05-20 DIAGNOSIS — N19 Unspecified kidney failure: Secondary | ICD-10-CM | POA: Diagnosis not present

## 2018-05-20 DIAGNOSIS — Z79899 Other long term (current) drug therapy: Secondary | ICD-10-CM

## 2018-05-20 DIAGNOSIS — J90 Pleural effusion, not elsewhere classified: Secondary | ICD-10-CM | POA: Diagnosis not present

## 2018-05-20 DIAGNOSIS — M1990 Unspecified osteoarthritis, unspecified site: Secondary | ICD-10-CM | POA: Diagnosis present

## 2018-05-20 DIAGNOSIS — R1111 Vomiting without nausea: Secondary | ICD-10-CM | POA: Diagnosis not present

## 2018-05-20 DIAGNOSIS — N183 Chronic kidney disease, stage 3 (moderate): Secondary | ICD-10-CM | POA: Diagnosis present

## 2018-05-20 DIAGNOSIS — R404 Transient alteration of awareness: Secondary | ICD-10-CM | POA: Diagnosis not present

## 2018-05-20 DIAGNOSIS — F02818 Dementia in other diseases classified elsewhere, unspecified severity, with other behavioral disturbance: Secondary | ICD-10-CM | POA: Diagnosis present

## 2018-05-20 DIAGNOSIS — S0990XA Unspecified injury of head, initial encounter: Secondary | ICD-10-CM | POA: Diagnosis not present

## 2018-05-20 HISTORY — DX: Unspecified dementia, unspecified severity, without behavioral disturbance, psychotic disturbance, mood disturbance, and anxiety: F03.90

## 2018-05-20 HISTORY — DX: Unspecified dementia without behavioral disturbance: F03.90

## 2018-05-20 LAB — CBC WITH DIFFERENTIAL/PLATELET
Abs Immature Granulocytes: 0.07 10*3/uL (ref 0.00–0.07)
Basophils Absolute: 0 10*3/uL (ref 0.0–0.1)
Basophils Relative: 0 %
Eosinophils Absolute: 0 10*3/uL (ref 0.0–0.5)
Eosinophils Relative: 0 %
HCT: 47 % (ref 39.0–52.0)
Hemoglobin: 15.4 g/dL (ref 13.0–17.0)
Immature Granulocytes: 1 %
Lymphocytes Relative: 6 %
Lymphs Abs: 0.8 10*3/uL (ref 0.7–4.0)
MCH: 29.8 pg (ref 26.0–34.0)
MCHC: 32.8 g/dL (ref 30.0–36.0)
MCV: 90.9 fL (ref 80.0–100.0)
Monocytes Absolute: 0.8 10*3/uL (ref 0.1–1.0)
Monocytes Relative: 6 %
Neutro Abs: 10.9 10*3/uL — ABNORMAL HIGH (ref 1.7–7.7)
Neutrophils Relative %: 87 %
Platelets: 232 10*3/uL (ref 150–400)
RBC: 5.17 MIL/uL (ref 4.22–5.81)
RDW: 14.2 % (ref 11.5–15.5)
WBC: 12.6 10*3/uL — ABNORMAL HIGH (ref 4.0–10.5)
nRBC: 0 % (ref 0.0–0.2)

## 2018-05-20 LAB — COMPREHENSIVE METABOLIC PANEL
ALK PHOS: 135 U/L — AB (ref 38–126)
ALT: 25 U/L (ref 0–44)
AST: 23 U/L (ref 15–41)
Albumin: 4.4 g/dL (ref 3.5–5.0)
Anion gap: 17 — ABNORMAL HIGH (ref 5–15)
BUN: 68 mg/dL — ABNORMAL HIGH (ref 8–23)
CALCIUM: 9.4 mg/dL (ref 8.9–10.3)
CO2: 21 mmol/L — AB (ref 22–32)
CREATININE: 4.75 mg/dL — AB (ref 0.61–1.24)
Chloride: 104 mmol/L (ref 98–111)
GFR, EST AFRICAN AMERICAN: 13 mL/min — AB (ref >60)
GFR, EST NON AFRICAN AMERICAN: 11 mL/min — AB (ref >60)
Glucose, Bld: 133 mg/dL — ABNORMAL HIGH (ref 70–99)
Potassium: 5 mmol/L (ref 3.5–5.1)
Sodium: 142 mmol/L (ref 135–145)
Total Bilirubin: 1.1 mg/dL (ref 0.3–1.2)
Total Protein: 8.4 g/dL — ABNORMAL HIGH (ref 6.5–8.1)

## 2018-05-20 LAB — PROTIME-INR
INR: 0.93
Prothrombin Time: 12.4 seconds (ref 11.4–15.2)

## 2018-05-20 LAB — URINALYSIS, ROUTINE W REFLEX MICROSCOPIC
BILIRUBIN URINE: NEGATIVE
Glucose, UA: >=500 mg/dL — AB
KETONES UR: 20 mg/dL — AB
NITRITE: NEGATIVE
Specific Gravity, Urine: 1.012 (ref 1.005–1.030)
pH: 6 (ref 5.0–8.0)

## 2018-05-20 LAB — CBG MONITORING, ED
GLUCOSE-CAPILLARY: 97 mg/dL (ref 70–99)
Glucose-Capillary: 109 mg/dL — ABNORMAL HIGH (ref 70–99)

## 2018-05-20 LAB — I-STAT CG4 LACTIC ACID, ED
LACTIC ACID, VENOUS: 1.49 mmol/L (ref 0.5–1.9)
Lactic Acid, Venous: 3.14 mmol/L (ref 0.5–1.9)

## 2018-05-20 LAB — CK: Total CK: 178 U/L (ref 49–397)

## 2018-05-20 MED ORDER — SODIUM CHLORIDE 0.9 % IV SOLN
1.0000 g | Freq: Once | INTRAVENOUS | Status: AC
  Administered 2018-05-20: 1 g via INTRAVENOUS
  Filled 2018-05-20: qty 10

## 2018-05-20 MED ORDER — SODIUM CHLORIDE 0.9 % IV SOLN
Freq: Once | INTRAVENOUS | Status: AC
  Administered 2018-05-20: 19:00:00 via INTRAVENOUS

## 2018-05-20 MED ORDER — SODIUM CHLORIDE 0.9 % IV BOLUS
500.0000 mL | Freq: Once | INTRAVENOUS | Status: AC
  Administered 2018-05-20: 500 mL via INTRAVENOUS

## 2018-05-20 NOTE — ED Notes (Signed)
Report given to Emeterio Reeve, RN 4E.

## 2018-05-20 NOTE — ED Notes (Signed)
Patient transported to X-ray 

## 2018-05-20 NOTE — ED Notes (Signed)
Pt has a condom cath placed to obtain urine sample.  RN notified.

## 2018-05-20 NOTE — H&P (Addendum)
Stephen Frost ZOX:[id number] DOB: 1940-12-28 DOA: 05/20/2018     PCP: Donita Brooks, MD  [facility] [place] [facility]:    NEurology     Dr. Dwyane Dee   GI  Dr. Lyndel Safe    Patient arrived to ER on 05/20/18 at 1600  Patient coming from: home Lives  With family    Chief Complaint:  Chief Complaint  Patient presents with  . Fall  . Possible UTI    HPI: Stephen Frost is a 78 y.o. male with medical history significant of dementia and Parkinson diabetes mellitus, HLD    Presented with fall EMS brought him from home fell sometimes last night has been on the floor for unknown amount of time picked off the floor by EMS around 3:30 PM but may have been down since 3 AM history of dementia unable to provide his own history he has been smelling of strong urine EMS administered Tylenol and Zofran but patient vomited the Tylenol At baseline patient is mostly bedbound secondary to [place] but  Gets up every day to the breakfast table    family does try to get him up to eat breakfast and lunch he falls frequently and has fallen out of bed 2 days ago as well he has been having significant back pain.  Given her states that she works full-time and sleeps in a separate room so she is unsure how many times she has fallen out of bed and for how long his been on the floor she is unsure of any fever that he has had. HAve not had much PO over past 48h Regarding pertinent Chronic problems: DM2 on Lantus and metformin hypertension on losartan   While in ER:  The following Work up has been ordered so far:  Orders Placed This Encounter  Procedures  . Urine culture  . CT Head Wo Contrast  . CT Cervical Spine Wo Contrast  . DG Chest Port 1 View  . DG Thoracic Spine 2 View  . DG Lumbar Spine 2-3 Views  . DG Pelvis Portable  . Comprehensive metabolic panel  . CBC with Differential  . Protime-INR  . Urinalysis, Routine w reflex microscopic  . CK  . NPO Except for Meds  . Saline Lock  IV, Maintain IV access  . Check Rectal Temperature  . Bladder scan  . Consult to hospitalist  . I-Stat CG4 Lactic Acid, ED      Following Medications were ordered in ER: Medications  cefTRIAXone (ROCEPHIN) 1 g in sodium chloride 0.9 % 100 mL IVPB (has no administration in time range)  0.9 %  sodium chloride infusion ( Intravenous New Bag/Given (Non-Interop) 05/20/18 1904)  sodium chloride 0.9 % bolus 500 mL (0 mLs Intravenous Stopped 05/20/18 2026)    Significant initial  Findings: Abnormal Labs Reviewed  COMPREHENSIVE METABOLIC PANEL - Abnormal; Notable for the following components:      Result Value   CO2 21 (*)    Glucose, Bld 133 (*)    BUN 68 (*)    Creatinine, Ser 4.75 (*)    Total Protein 8.4 (*)    Alkaline Phosphatase 135 (*)    GFR calc non Af Amer 11 (*)    GFR calc Af Amer 13 (*)    Anion gap 17 (*)    All other components within normal limits  CBC WITH DIFFERENTIAL/PLATELET - Abnormal; Notable for the following components:   WBC 12.6 (*)    Neutro Abs 10.9 (*)  All other components within normal limits  URINALYSIS, ROUTINE W REFLEX MICROSCOPIC - Abnormal; Notable for the following components:   Glucose, UA >=500 (*)    Hgb urine dipstick LARGE (*)    Ketones, ur 20 (*)    Protein, ur >=300 (*)    Leukocytes, UA SMALL (*)    RBC / HPF >50 (*)    Bacteria, UA RARE (*)    All other components within normal limits  I-STAT CG4 LACTIC ACID, ED - Abnormal; Notable for the following components:   Lactic Acid, Venous 3.14 (*)    All other components within normal limits    Lactic Acid, Venous    Component Value Date/Time   LATICACIDVEN 3.14 (HH) 05/20/2018 1642    Na 142 K 5.0  Cr Up from baseline see below Lab Results  Component Value Date   CREATININE 4.75 (H) 05/20/2018   CREATININE 0.97 04/28/2014   CREATININE 0.95 04/27/2014      WBC  12.6   HG/HCT  stable    Component Value Date/Time   HGB 15.4 05/20/2018 1618   HCT 47.0 05/20/2018 1618    INR 0.93  CK 178   Troponin (Point of Care Test) No results for input(s): TROPIPOC in the last 72 hours.    BNP (last 3 results) No results for input(s): BNP in the last 8760 hours.  ProBNP (last 3 results) No results for input(s): PROBNP in the last 8760 hours.     UA  evidence of UTI     Thoracic non acute Lumbar non acute Pelvic non acute  CXR non acute   CT HEAD NON acute   ECG:   Personally reviewed by me showing: HR : 100  Rhythm:  Sinus tachycardia  no evidence of ischemic changes QTC 452     ED Triage Vitals  Enc Vitals Group     BP 05/20/18 1614 (!) 171/103     Pulse Rate 05/20/18 1614 (!) 114     Resp 05/20/18 1614 20     Temp 05/20/18 1614 98.1 F (36.7 C)     Temp Source 05/20/18 1614 Oral     SpO2 05/20/18 1611 98 %     Weight 05/20/18 1614 121 lb 4.1 oz (55 kg)     Height 05/20/18 1614 5\' 6"  (1.676 m)     Head Circumference --      Peak Flow --      Pain Score --      Pain Loc --      Pain Edu? --      Excl. in [place]? --   TMAX(24)@       Latest  Blood pressure (!) 186/97, pulse 89, temperature 98.1 F (36.7 C), temperature source Rectal, resp. rate 15, height 5\' 6"  (1.676 m), weight 55 kg, SpO2 98 %.    Hospitalist was called for admission for AKI, dehydration    Review of Systems:    Pertinent positives include: back pain fatigue,   Constitutional:  No weight loss, night sweats, Fevers, chills,weight loss  HEENT:  No headaches, Difficulty swallowing,Tooth/dental problems,Sore throat,  No sneezing, itching, ear ache, nasal congestion, post nasal drip,  Cardio-vascular:  No chest pain, Orthopnea, PND, anasarca, dizziness, palpitations.no Bilateral lower extremity swelling  GI:  No heartburn, indigestion, abdominal pain, nausea, vomiting, diarrhea, change in bowel habits, loss of appetite, melena, blood in stool, hematemesis Resp:  no shortness of breath at rest. No dyspnea on exertion, No excess mucus, no productive cough,  No  non-productive cough, No coughing up of blood.No change in color of mucus.No wheezing. Skin:  no rash or lesions. No jaundice GU:  no dysuria, change in color of urine, no urgency or frequency. No straining to urinate.  No flank pain.  Musculoskeletal:  No joint pain or no joint swelling. No decreased range of motion. No back pain.  Psych:  No change in mood or affect. No depression or anxiety. No memory loss.  Neuro: no localizing neurological complaints, no tingling, no weakness, no double vision, no gait abnormality, no slurred speech, no confusion  All systems reviewed and apart from [place] all are negative  Past Medical History:   Past Medical History:  Diagnosis Date  . Acid reflux   . Arthritis    in spine  . Cataract 2012   bilateral eye   . Dementia ([place]) 05/20/2018   Per EMS and caregiver  . Diabetes mellitus without complication ([place])    type 2  . Elevated cholesterol   . Melanoma ([place])    foot  . Pneumonia       Past Surgical History:  Procedure Laterality Date  . CATARACT EXTRACTION, BILATERAL    . EYE SURGERY    . GLAUCOMA SURGERY    . HERNIA REPAIR     double  . TONSILLECTOMY      Social History:  Ambulatory walker needs help getting up once up able to walk short distance     reports that he has never smoked. He has never used smokeless tobacco. He reports that he does not drink alcohol or use drugs.     Family History:   Family History  Problem Relation Age of Onset  . Alcoholism Mother   . Heart attack Father   . Colon cancer Neg Hx   . Esophageal cancer Neg Hx   . Stomach cancer Neg Hx   . Rectal cancer Neg Hx     Allergies: Allergies  Allergen Reactions  . Insulin Lispro Other (See Comments)  . Metformin And Related Diarrhea     Prior to Admission medications   Medication Sig Start Date End Date Taking? Authorizing Provider  AMBULATORY NON FORMULARY MEDICATION 1 drop 3 (three) times daily. Symbrinza eye drops    [provider]  atorvastatin (LIPITOR) 40 MG tablet Take 40 mg by mouth daily.    [provider]  brimonidine (ALPHAGAN P) 0.1 % SOLN 1 drop daily.    [provider]  carbidopa-levodopa (SINEMET IR) 10-100 MG tablet Take 1 tablet by mouth 3 (three) times daily.    [provider]  gabapentin (NEURONTIN) 800 MG tablet Take 800 mg by mouth 3 (three) times daily.    [provider]  glucose 4 GM chewable tablet Chew 1 tablet by mouth as needed for low blood sugar.    [provider]  insulin glargine (LANTUS) 100 UNIT/ML injection Inject 0.3 mLs (30 Units total) into the skin daily. 04/30/14   [name], DO  latanoprost (XALATAN) 0.005 % ophthalmic solution 1 drop at bedtime.    [provider]  losartan (COZAAR) 50 MG tablet Take 25 mg by mouth daily.    [provider]  metformin (FORTAMET) 500 MG (OSM) 24 hr tablet Take 500 mg by mouth daily with breakfast.    [provider]  omeprazole (PRILOSEC) 20 MG capsule Take 20 mg by mouth daily.    [provider]  oxybutynin (DITROPAN) 5 MG tablet Take 5 mg  by mouth daily.    [provider]  SEMAGLUTIDE, 1 MG/DOSE, [place] Inject 1 Units into the skin once a week.    [provider]  timolol (TIMOPTIC) 0.5 % ophthalmic solution 1 drop 2 (two) times daily.    [provider]   Physical Exam: Blood pressure (!) 186/97, pulse 89, temperature 98.1 F (36.7 C), temperature source Rectal, resp. rate 15, height 5\' 6"  (1.676 m), weight 55 kg, SpO2 98 %. 1. General:  in No Acute distress  Chronically ill  -appearing 2. Psychological: Alert but not Oriented 3. Head/ENT:    Dry Mucous Membranes                          Head Non traumatic, neck supple                          Poor Dentition 4. SKIN:  decreased Skin turgor,  Skin clean Dry and intact no rash 5. Heart: Regular rate and rhythm no Murmur, no Rub or gallop 6. Lungs:   no wheezes or  crackles   7. Abdomen: Soft,  non-tender, Non distended  bowel sounds present 8. Lower extremities: no clubbing, cyanosis, or  edema 9. Neurologically Grossly intact, moving all 4 extremities equally  10. MSK: Normal range of motion   LABS:     Recent Labs  Lab 05/20/18 1618  WBC 12.6*  NEUTROABS 10.9*  HGB 15.4  HCT 47.0  MCV 90.9  PLT 789   Basic Metabolic Panel: Recent Labs  Lab 05/20/18 1618  NA 142  K 5.0  CL 104  CO2 21*  GLUCOSE 133*  BUN 68*  CREATININE 4.75*  CALCIUM 9.4      Recent Labs  Lab 05/20/18 1618  AST 23  ALT 25  ALKPHOS 135*  BILITOT 1.1  PROT 8.4*  ALBUMIN 4.4   No results for input(s): LIPASE, AMYLASE in the last 168 hours. No results for input(s): AMMONIA in the last 168 hours.    HbA1C: No results for input(s): HGBA1C in the last 72 hours. CBG: No results for input(s): GLUCAP in the last 168 hours.    Urine analysis:    Component Value Date/Time   COLORURINE YELLOW 05/20/2018 2043   APPEARANCEUR CLEAR 05/20/2018 2043   LABSPEC 1.012 05/20/2018 2043   PHURINE 6.0 05/20/2018 2043   GLUCOSEU >=500 (A) 05/20/2018 2043   HGBUR LARGE (A) 05/20/2018 2043   BILIRUBINUR NEGATIVE 05/20/2018 2043   KETONESUR 20 (A) 05/20/2018 2043   PROTEINUR >=300 (A) 05/20/2018 2043   UROBILINOGEN 0.2 04/24/2014 0830   NITRITE NEGATIVE 05/20/2018 2043   LEUKOCYTESUR SMALL (A) 05/20/2018 2043    Cultures:    Component Value Date/Time   SDES URINE, RANDOM 04/24/2014 1822   SPECREQUEST NONE 04/24/2014 1822   CULT  04/24/2014 0840    NO GROWTH 5 DAYS Performed at [facility] 04/26/2014 FINAL 04/24/2014 1822     Radiological Exams on Admission: Dg Thoracic Spine 2 View  Result Date: 05/20/2018 CLINICAL DATA:  Weakness EXAM: THORACIC SPINE 2 VIEWS COMPARISON:  Chest x-ray 05/09/2014 FINDINGS: Minimal rightward curvature of the midthoracic spine. Vertebral body heights are maintained. Degenerative osteophytes of the  spine. IMPRESSION: No acute osseous abnormality. Electronically Signed   By: Donavan Foil M.D.   On: 05/20/2018 19:13   Dg Lumbar Spine 2-3 Views  Result Date: 05/20/2018 CLINICAL DATA:  Weakness  EXAM: LUMBAR SPINE - 2-3 VIEW COMPARISON:  None. FINDINGS: Vertebral body heights are maintained. Mild degenerative changes at L5-S1. IMPRESSION: Mild degenerative changes.  No acute osseous abnormality. Electronically Signed   By: Donavan Foil M.D.   On: 05/20/2018 19:14   Ct Head Wo Contrast  Result Date: 05/20/2018 CLINICAL DATA:  Dementia patient post unwitnessed fall. Head trauma, minor, GCS>=13, high clinical risk, initial exam; C-spine trauma, high clinical risk (NEXUS/CCR) EXAM: CT HEAD WITHOUT CONTRAST CT CERVICAL SPINE WITHOUT CONTRAST TECHNIQUE: Multidetector CT imaging of the head and cervical spine was performed following the standard protocol without intravenous contrast. Multiplanar CT image reconstructions of the cervical spine were also generated. COMPARISON:  Head and cervical spine CT 11/15/2013 FINDINGS: CT HEAD FINDINGS Brain: Unchanged atrophy and chronic small vessel ischemia. No intracranial hemorrhage, mass effect, or midline shift. No hydrocephalus. The basilar cisterns are patent. No evidence of territorial infarct or acute ischemia. No extra-axial or intracranial fluid collection. Vascular: Atherosclerosis of skullbase vasculature without hyperdense vessel or abnormal calcification. Skull: No fracture or focal lesion. Sinuses/Orbits: No acute findings. Minimal chronic debris in left side of sphenoid sinus. Bilateral cataract resection. Other: None. CT CERVICAL SPINE FINDINGS Alignment: Slight exaggerated cervical lordosis, no traumatic subluxation. Skull base and vertebrae: No acute fracture. The dens and skull base are intact. Heterogeneous marrow appearance is unchanged from prior exam and likely combination of osteopenia and degenerative change. Stable subcentimeter benign lucency  within posterior C6 vertebral body. Soft tissues and spinal canal: No prevertebral fluid or swelling. No visible canal hematoma. Disc levels: Stable multilevel degenerative disc disease and facet arthropathy. Upper chest: Negative. Other: None. IMPRESSION: 1. No acute intracranial abnormality. No skull fracture. Stable atrophy and chronic small vessel ischemia. 2. Stable degenerative change in the cervical spine without acute fracture or subluxation. Electronically Signed   By: Keith Rake M.D.   On: 05/20/2018 18:55   Ct Cervical Spine Wo Contrast  Result Date: 05/20/2018 CLINICAL DATA:  Dementia patient post unwitnessed fall. Head trauma, minor, GCS>=13, high clinical risk, initial exam; C-spine trauma, high clinical risk (NEXUS/CCR) EXAM: CT HEAD WITHOUT CONTRAST CT CERVICAL SPINE WITHOUT CONTRAST TECHNIQUE: Multidetector CT imaging of the head and cervical spine was performed following the standard protocol without intravenous contrast. Multiplanar CT image reconstructions of the cervical spine were also generated. COMPARISON:  Head and cervical spine CT 11/15/2013 FINDINGS: CT HEAD FINDINGS Brain: Unchanged atrophy and chronic small vessel ischemia. No intracranial hemorrhage, mass effect, or midline shift. No hydrocephalus. The basilar cisterns are patent. No evidence of territorial infarct or acute ischemia. No extra-axial or intracranial fluid collection. Vascular: Atherosclerosis of skullbase vasculature without hyperdense vessel or abnormal calcification. Skull: No fracture or focal lesion. Sinuses/Orbits: No acute findings. Minimal chronic debris in left side of sphenoid sinus. Bilateral cataract resection. Other: None. CT CERVICAL SPINE FINDINGS Alignment: Slight exaggerated cervical lordosis, no traumatic subluxation. Skull base and vertebrae: No acute fracture. The dens and skull base are intact. Heterogeneous marrow appearance is unchanged from prior exam and likely combination of osteopenia  and degenerative change. Stable subcentimeter benign lucency within posterior C6 vertebral body. Soft tissues and spinal canal: No prevertebral fluid or swelling. No visible canal hematoma. Disc levels: Stable multilevel degenerative disc disease and facet arthropathy. Upper chest: Negative. Other: None. IMPRESSION: 1. No acute intracranial abnormality. No skull fracture. Stable atrophy and chronic small vessel ischemia. 2. Stable degenerative change in the cervical spine without acute fracture or subluxation. Electronically Signed   By: Keith Rake  M.D.   On: 05/20/2018 18:55   Dg Pelvis Portable  Result Date: 05/20/2018 CLINICAL DATA:  Weakness EXAM: PORTABLE PELVIS 1-2 VIEWS COMPARISON:  None. FINDINGS: Vascular calcifications. Pubic symphysis and rami are intact. SI joints are non widened. No fracture or malalignment. Mild degenerative changes. IMPRESSION: No acute osseous abnormality Electronically Signed   By: Donavan Foil M.D.   On: 05/20/2018 19:12   Dg Chest Port 1 View  Result Date: 05/20/2018 CLINICAL DATA:  Fall EXAM: PORTABLE CHEST 1 VIEW COMPARISON:  05/09/2014 FINDINGS: No acute consolidation or effusion. Normal cardiomediastinal silhouette with aortic atherosclerosis. No pneumothorax. IMPRESSION: No active disease. Electronically Signed   By: Donavan Foil M.D.   On: 05/20/2018 19:11    Chart has been reviewed    Assessment/Plan  77 y.o. male with medical history significant of dementia and Parkinson diabetes mellitus, HLD  Admitted for AKI, fall   Present on Admission: . UTI (urinary tract infection) - - treat with Rocephin         await results of urine culture and adjust antibiotic coverage as needed Fall secondary to debility.  Will need PT OT evaluation possible placement.  Plain imaging show no evidence of fracture  . Encephalopathy acute -   - most likely multifactorial secondary to combination of  Infection  mild dehydration secondary to decreased by mouth intake,   polypharmacy   - Will rehydrate   - treat underlining infection   - Hold contributing medications   - if no improvement may need further imaging to evaluate for CNS pathology pathology such as MRI of the brain   - neurological exam appears to be nonfocal but patient unable to cooperate fully    - no history of liver disease   . Dementia due to Parkinson's disease with behavioral disturbance ([place]) -continue Sinemet if able to tolerate p.o. likely would benefit from placement physical therapy consult . AKI (acute kidney injury) ([place]) - - likely secondary to dehydration,       check FeNA       Rehydrate with IV fluids      HOLD ARBi and nephrotoxic medications  History does not suggest urinary retention or obstruction     in ER only 200 cc of urine in the bladder  Given severity would would obtain renal US to evaluate for other causes     If persists despite fluid resuscitation will need renal consult.   . Dehydration -rehydrate and follow fluid status . Malnutrition ([place]) we will order nutritional consult DM 2-patient is allergic to Humulin??  Wife states it causes him to go into coma.  Hold Lantus given low blood sugar and acute renal failure will follow glucose levels and treat as needed, check hemoglobin A1c  Other plan as per orders.  DVT prophylaxis:   SCD    Code Status:  DNR/DNI as per family  I had personally discussed CODE STATUS with patient and family   Family Communication:   Family  at  Bedside  plan of care was discussed with Wife   Disposition Plan:    likely will need placement for rehabilitation                                               Would benefit from PT/OT eval prior to DC  Ordered  Swallow eval - SLP ordered                   Social Work  consulted                   Nutrition    consulted                       Palliative care    consulted                                        Consults called: none  Admission status:    Obs     Level of care    medical floor          Toy Baker 05/20/2018, 11:08 PM     [facility]  Pager [phone]   after 2 AM please page floor coverage PA If 7AM-7PM, please contact the day team taking care of the patient  [email or web address]  Password [email or web address]

## 2018-05-20 NOTE — ED Notes (Signed)
ED TO INPATIENT HANDOFF REPORT  Name/Age/Gender Stephen Frost 78 y.o. male  Code Status Code Status History    Date Active Date Inactive Code Status Order ID Comments User Context   04/24/2014 1238 04/30/2014 1757 Full Code [id number]  Delfina Redwood, MD ED      Home/SNF/Other Home  Chief Complaint fall  Level of Care/Admitting Diagnosis ED Disposition    ED Disposition Condition [facility] Area: [facility] [100102]  Level of Care: Telemetry [5]  Admit to tele based on following criteria: Other see comments  Comments: hyperkalemia  Diagnosis: UTI (urinary tract infection) [275170]  Admitting Physician: Toy Baker [[id number]]  Attending Physician: Toy Baker [[id number]]  PT Class (Do Not Modify): Observation [104]  PT Acc Code (Do Not Modify): Observation [10022]       Medical History Past Medical History:  Diagnosis Date  . Acid reflux   . Arthritis    in spine  . Cataract 2012   bilateral eye   . Dementia ([place]) 05/20/2018   Per EMS and caregiver  . Diabetes mellitus without complication ([place])    type 2  . Elevated cholesterol   . Melanoma ([place])    foot  . Pneumonia     Allergies Allergies  Allergen Reactions  . Insulin Lispro Other (See Comments)  . Metformin And Related Diarrhea    IV Location/Drains/Wounds Patient Lines/Drains/Airways Status   Active Line/Drains/Airways    Name:   Placement date:   Placement time:   Site:   Days:   Peripheral IV 05/20/18 Left Forearm   05/20/18    1623    Forearm   less than 1   Peripheral IV 05/20/18 Left Antecubital   05/20/18    1643    Antecubital   less than 1          Labs/Imaging Results for orders placed or performed during the hospital encounter of 05/20/18 (from the past 48 hour(s))  Comprehensive metabolic panel     Status: Abnormal   Collection Time: 05/20/18  4:18 PM  Result Value Ref Range   Sodium 142 135 - 145 mmol/L   Potassium 5.0 3.5 -  5.1 mmol/L   Chloride 104 98 - 111 mmol/L   CO2 21 (L) 22 - 32 mmol/L   Glucose, Bld 133 (H) 70 - 99 mg/dL   BUN 68 (H) 8 - 23 mg/dL   Creatinine, Ser 4.75 (H) 0.61 - 1.24 mg/dL   Calcium 9.4 8.9 - 10.3 mg/dL   Total Protein 8.4 (H) 6.5 - 8.1 g/dL   Albumin 4.4 3.5 - 5.0 g/dL   AST 23 15 - 41 U/L   ALT 25 0 - 44 U/L   Alkaline Phosphatase 135 (H) 38 - 126 U/L   Total Bilirubin 1.1 0.3 - 1.2 mg/dL   GFR calc non Af Amer 11 (L) >60 mL/min   GFR calc Af Amer 13 (L) >60 mL/min   Anion gap 17 (H) 5 - 15    Comment: Performed at [facility], [place] [address] [place], [place] [address]  CBC with Differential     Status: Abnormal   Collection Time: 05/20/18  4:18 PM  Result Value Ref Range   WBC 12.6 (H) 4.0 - 10.5 K/uL   RBC 5.17 4.22 - 5.81 MIL/uL   Hemoglobin 15.4 13.0 - 17.0 g/dL   HCT 47.0 39.0 - 52.0 %   MCV 90.9 80.0 - 100.0 fL  MCH 29.8 26.0 - 34.0 pg   MCHC 32.8 30.0 - 36.0 g/dL   RDW 14.2 11.5 - 15.5 %   Platelets 232 150 - 400 K/uL   nRBC 0.0 0.0 - 0.2 %   Neutrophils Relative % 87 %   Neutro Abs 10.9 (H) 1.7 - 7.7 K/uL   Lymphocytes Relative 6 %   Lymphs Abs 0.8 0.7 - 4.0 K/uL   Monocytes Relative 6 %   Monocytes Absolute 0.8 0.1 - 1.0 K/uL   Eosinophils Relative 0 %   Eosinophils Absolute 0.0 0.0 - 0.5 K/uL   Basophils Relative 0 %   Basophils Absolute 0.0 0.0 - 0.1 K/uL   Immature Granulocytes 1 %   Abs Immature Granulocytes 0.07 0.00 - 0.07 K/uL    Comment: Performed at [facility], [place] [address], [place], [place] [address]  Protime-INR     Status: None   Collection Time: 05/20/18  4:18 PM  Result Value Ref Range   Prothrombin Time 12.4 11.4 - 15.2 seconds   INR 0.93     Comment: Performed at [facility], [place] [address] [place], [place] [address]  CK     Status: None   Collection Time: 05/20/18  4:18 PM  Result Value Ref Range   Total CK 178 49 - 397 U/L    Comment: Performed at [facility], [place] [address] [place], [place] [address]  I-Stat CG4 Lactic Acid, ED     Status: Abnormal   Collection Time: 05/20/18  4:42 PM  Result Value Ref Range   Lactic Acid, Venous 3.14 (HH) 0.5 - 1.9 mmol/L   Comment NOTIFIED PHYSICIAN   Urinalysis, Routine w reflex microscopic     Status: Abnormal   Collection Time: 05/20/18  8:43 PM  Result Value Ref Range   Color, Urine YELLOW YELLOW   APPearance CLEAR CLEAR   Specific Gravity, Urine 1.012 1.005 - 1.030   pH 6.0 5.0 - 8.0   Glucose, UA >=500 (A) NEGATIVE mg/dL   Hgb urine dipstick LARGE (A) NEGATIVE   Bilirubin Urine NEGATIVE NEGATIVE   Ketones, ur 20 (A) NEGATIVE mg/dL   Protein, ur >=300 (A) NEGATIVE mg/dL   Nitrite NEGATIVE NEGATIVE   Leukocytes, UA SMALL (A) NEGATIVE   RBC / HPF >50 (H) 0 - 5 RBC/hpf   WBC, UA 21-50 0 - 5 WBC/hpf   Bacteria, UA RARE (A) NONE SEEN   WBC Clumps PRESENT     Comment: Performed at [facility], [place] [address], [place], [place] [address]  I-Stat CG4 Lactic Acid, ED     Status: None   Collection Time: 05/20/18 10:30 PM  Result Value Ref Range   Lactic Acid, Venous 1.49 0.5 - 1.9 mmol/L  CBG monitoring, ED     Status: Abnormal   Collection Time: 05/20/18 10:37 PM  Result Value Ref Range   Glucose-Capillary 109 (H) 70 - 99 mg/dL  CBG monitoring, ED     Status: None   Collection Time: 05/20/18 11:43 PM  Result Value Ref Range   Glucose-Capillary 97 70 - 99 mg/dL   Comment 1 Notify RN    Comment 2 Document in Chart    Dg Thoracic Spine 2 View  Result Date: 05/20/2018 CLINICAL DATA:  Weakness EXAM: THORACIC SPINE 2 VIEWS COMPARISON:  Chest x-ray 05/09/2014 FINDINGS: Minimal rightward curvature of the midthoracic spine. Vertebral body heights are maintained. Degenerative osteophytes of the spine. IMPRESSION: No acute osseous  abnormality. Electronically Signed   By: Donavan Foil M.D.   On: 05/20/2018 19:13   Dg Lumbar Spine 2-3 Views  Result Date:  05/20/2018 CLINICAL DATA:  Weakness EXAM: LUMBAR SPINE - 2-3 VIEW COMPARISON:  None. FINDINGS: Vertebral body heights are maintained. Mild degenerative changes at L5-S1. IMPRESSION: Mild degenerative changes.  No acute osseous abnormality. Electronically Signed   By: Donavan Foil M.D.   On: 05/20/2018 19:14   Ct Head Wo Contrast  Result Date: 05/20/2018 CLINICAL DATA:  Dementia patient post unwitnessed fall. Head trauma, minor, GCS>=13, high clinical risk, initial exam; C-spine trauma, high clinical risk (NEXUS/CCR) EXAM: CT HEAD WITHOUT CONTRAST CT CERVICAL SPINE WITHOUT CONTRAST TECHNIQUE: Multidetector CT imaging of the head and cervical spine was performed following the standard protocol without intravenous contrast. Multiplanar CT image reconstructions of the cervical spine were also generated. COMPARISON:  Head and cervical spine CT 11/15/2013 FINDINGS: CT HEAD FINDINGS Brain: Unchanged atrophy and chronic small vessel ischemia. No intracranial hemorrhage, mass effect, or midline shift. No hydrocephalus. The basilar cisterns are patent. No evidence of territorial infarct or acute ischemia. No extra-axial or intracranial fluid collection. Vascular: Atherosclerosis of skullbase vasculature without hyperdense vessel or abnormal calcification. Skull: No fracture or focal lesion. Sinuses/Orbits: No acute findings. Minimal chronic debris in left side of sphenoid sinus. Bilateral cataract resection. Other: None. CT CERVICAL SPINE FINDINGS Alignment: Slight exaggerated cervical lordosis, no traumatic subluxation. Skull base and vertebrae: No acute fracture. The dens and skull base are intact. Heterogeneous marrow appearance is unchanged from prior exam and likely combination of osteopenia and degenerative change. Stable subcentimeter benign lucency within posterior C6 vertebral body. Soft tissues and spinal canal: No prevertebral fluid or swelling. No visible canal hematoma. Disc levels: Stable multilevel  degenerative disc disease and facet arthropathy. Upper chest: Negative. Other: None. IMPRESSION: 1. No acute intracranial abnormality. No skull fracture. Stable atrophy and chronic small vessel ischemia. 2. Stable degenerative change in the cervical spine without acute fracture or subluxation. Electronically Signed   By: Keith Rake M.D.   On: 05/20/2018 18:55   Ct Cervical Spine Wo Contrast  Result Date: 05/20/2018 CLINICAL DATA:  Dementia patient post unwitnessed fall. Head trauma, minor, GCS>=13, high clinical risk, initial exam; C-spine trauma, high clinical risk (NEXUS/CCR) EXAM: CT HEAD WITHOUT CONTRAST CT CERVICAL SPINE WITHOUT CONTRAST TECHNIQUE: Multidetector CT imaging of the head and cervical spine was performed following the standard protocol without intravenous contrast. Multiplanar CT image reconstructions of the cervical spine were also generated. COMPARISON:  Head and cervical spine CT 11/15/2013 FINDINGS: CT HEAD FINDINGS Brain: Unchanged atrophy and chronic small vessel ischemia. No intracranial hemorrhage, mass effect, or midline shift. No hydrocephalus. The basilar cisterns are patent. No evidence of territorial infarct or acute ischemia. No extra-axial or intracranial fluid collection. Vascular: Atherosclerosis of skullbase vasculature without hyperdense vessel or abnormal calcification. Skull: No fracture or focal lesion. Sinuses/Orbits: No acute findings. Minimal chronic debris in left side of sphenoid sinus. Bilateral cataract resection. Other: None. CT CERVICAL SPINE FINDINGS Alignment: Slight exaggerated cervical lordosis, no traumatic subluxation. Skull base and vertebrae: No acute fracture. The dens and skull base are intact. Heterogeneous marrow appearance is unchanged from prior exam and likely combination of osteopenia and degenerative change. Stable subcentimeter benign lucency within posterior C6 vertebral body. Soft tissues and spinal canal: No prevertebral fluid or  swelling. No visible canal hematoma. Disc levels: Stable multilevel degenerative disc disease and facet arthropathy. Upper chest: Negative. Other: None. IMPRESSION: 1. No acute intracranial abnormality.  No skull fracture. Stable atrophy and chronic small vessel ischemia. 2. Stable degenerative change in the cervical spine without acute fracture or subluxation. Electronically Signed   By: Keith Rake M.D.   On: 05/20/2018 18:55   Dg Pelvis Portable  Result Date: 05/20/2018 CLINICAL DATA:  Weakness EXAM: PORTABLE PELVIS 1-2 VIEWS COMPARISON:  None. FINDINGS: Vascular calcifications. Pubic symphysis and rami are intact. SI joints are non widened. No fracture or malalignment. Mild degenerative changes. IMPRESSION: No acute osseous abnormality Electronically Signed   By: Donavan Foil M.D.   On: 05/20/2018 19:12   Dg Chest Port 1 View  Result Date: 05/20/2018 CLINICAL DATA:  Fall EXAM: PORTABLE CHEST 1 VIEW COMPARISON:  05/09/2014 FINDINGS: No acute consolidation or effusion. Normal cardiomediastinal silhouette with aortic atherosclerosis. No pneumothorax. IMPRESSION: No active disease. Electronically Signed   By: Donavan Foil M.D.   On: 05/20/2018 19:11    Pending Labs Unresulted Labs (From admission, onward)    Start     Ordered   05/21/18 0500  Hemoglobin A1c  Tomorrow morning,   R    Comments:  To assess prior glycemic control    05/20/18 2210   05/21/18 0500  Prealbumin  Tomorrow morning,   R     05/20/18 2322   05/20/18 2207  Culture, blood (routine x 2)  BLOOD CULTURE X 2,   STAT     05/20/18 2206   05/20/18 2158  Urine culture  ONCE - STAT,   STAT     05/20/18 2158   Signed and Held  Magnesium  Tomorrow morning,   R    Comments:  Call MD if <1.5    Signed and Held   Signed and Held  Phosphorus  Tomorrow morning,   R     Signed and Held   Signed and Held  TSH  Once,   R    Comments:  Cancel if already done within 1 month and notify MD    Signed and Held   Signed and Held   Comprehensive metabolic panel  Once,   R    Comments:  Cal MD for K<3.5 or >5.0    Signed and Held   Visual merchandiser and Held  CBC  Once,   R    Comments:  Call for hg <8.0    Signed and Held   Visual merchandiser and Held  Creatinine, urine, random  Once,   R     Signed and Held   Signed and Held  Sodium, urine, random  Once,   R     Signed and Held   Signed and Held  Lactic acid, plasma  STAT Now then every 3 hours,   STAT     Signed and Held   Signed and Held  Procalcitonin  Add-on,   R     Signed and Held          Vitals/Pain Today's Vitals   05/20/18 2030 05/20/18 2100 05/20/18 2130 05/20/18 2200  BP: (!) 177/98 (!) 186/97 (!) 182/91 (!) 169/93  Pulse: 85 89 82 86  Resp: 18 15 14 19   Temp:      TempSrc:      SpO2: 95% 98% 99% 95%  Weight:      Height:        Isolation Precautions No active isolations  Medications Medications  0.9 %  sodium chloride infusion ( Intravenous Stopped 05/20/18 2218)  sodium chloride 0.9 % bolus 500 mL (0 mLs Intravenous Stopped 05/20/18 2026)  cefTRIAXone (ROCEPHIN) 1 g in sodium chloride 0.9 % 100 mL IVPB (0 g Intravenous Stopped 05/20/18 2332)    Mobility non-ambulatory

## 2018-05-20 NOTE — ED Notes (Signed)
ED Provider at bedside. 

## 2018-05-20 NOTE — ED Notes (Signed)
Bed: [facility] Expected date:  Expected time:  Means of arrival:  Comments: EMS fall 

## 2018-05-20 NOTE — ED Notes (Signed)
Patient transported to CT 

## 2018-05-20 NOTE — ED Triage Notes (Signed)
Patient from home. Patient arrived via [facility]. Patient had fall sometime last night, caregiver believes patient has been on floor since 0300 last night, patient was picked off of floor by EMS around 1530. Patient has Hx of dementia and limited vocalization. Caregiver and EMS also state smell of strong urine odor. Patient was given Tylenol and Zofran IV. Patient vomited tylenol. Shortly after.

## 2018-05-20 NOTE — ED Provider Notes (Signed)
[facility] DEPT Provider Note   CSN: [id number] Arrival date & time: 05/20/18  1600     History   Chief Complaint Chief Complaint  Patient presents with  . Fall  . Possible UTI    HPI Stephen Frost is a 78 y.o. male.  HPI Patient is being seen with his wife here as historian.  She reports at baseline he spends most of his time in bed due to history of Parkinson's.  She reports that she does usually have him get up to get his breakfast and lunch.  She reports he does fall frequently.  He reportedly fell out of bed 2 days ago.  Since that time he has complained of severe pain in his back and will not get out of the bed.  She reports that she works full-time and sleeps in a separate room so she is not really sure what happened 2 days ago when he fell.  He reports he just said that he fell out of bed and she is not sure if he had spent any significant amount of time on the floor before getting back up.  He reports that is not completely atypical for him to spend much of his time in bed however now, with any kind of movements or attempts to get up he complains of severe pain in his back.  He is really speaking very little to none.  She reports that is been basically how he is been for couple of days.  She reports only things he said is get away from me and slapped at her when she tried to roll him to clean his sheets.  He has not had a fever that she is aware of.  He does not have pain medications other than Aleve and Tylenol.  Patient's wife reports that today, their home health aide came and was concerned and suggested he be evaluated at the emergency department.  Patient is really not contributing anything to the immediate history. Past Medical History:  Diagnosis Date  . Acid reflux   . Arthritis    in spine  . Cataract 2012   bilateral eye   . Dementia ([place]) 05/20/2018   Per EMS and caregiver  . Diabetes mellitus without complication ([place])    type 2  .  Elevated cholesterol   . Melanoma ([place])    foot  . Pneumonia     Patient Active Problem List   Diagnosis Date Noted  . Hyperammonemia ([place]) 04/25/2014  . Encephalopathy acute 04/24/2014  . Hypoglycemia 04/24/2014  .  probable Aspiration pneumonia with sepsis 04/24/2014  . Hypothermia 04/24/2014  . DM type 2 (diabetes mellitus, type 2) ([place]) 04/24/2014    Past Surgical History:  Procedure Laterality Date  . CATARACT EXTRACTION, BILATERAL    . EYE SURGERY    . GLAUCOMA SURGERY    . HERNIA REPAIR     double  . TONSILLECTOMY          Home Medications    Prior to Admission medications   Medication Sig Start Date End Date Taking? Authorizing Provider  AMBULATORY NON FORMULARY MEDICATION 1 drop 3 (three) times daily. Symbrinza eye drops    [provider]  atorvastatin (LIPITOR) 40 MG tablet Take 40 mg by mouth daily.    [provider]  brimonidine (ALPHAGAN P) 0.1 % SOLN 1 drop daily.    [provider]  carbidopa-levodopa (SINEMET IR) 10-100 MG tablet Take 1 tablet by mouth 3 (three)  times daily.    [provider]  gabapentin (NEURONTIN) 800 MG tablet Take 800 mg by mouth 3 (three) times daily.    [provider]  glucose 4 GM chewable tablet Chew 1 tablet by mouth as needed for low blood sugar.    [provider]  insulin glargine (LANTUS) 100 UNIT/ML injection Inject 0.3 mLs (30 Units total) into the skin daily. 04/30/14   [name], DO  latanoprost (XALATAN) 0.005 % ophthalmic solution 1 drop at bedtime.    [provider]  losartan (COZAAR) 50 MG tablet Take 25 mg by mouth daily.    [provider]  metformin (FORTAMET) 500 MG (OSM) 24 hr tablet Take 500 mg by mouth daily with breakfast.    [provider]  omeprazole (PRILOSEC) 20 MG capsule Take 20 mg by mouth daily.    [provider]  oxybutynin (DITROPAN) 5 MG tablet Take 5 mg by mouth daily.    [provider]    SEMAGLUTIDE, 1 MG/DOSE, [place] Inject 1 Units into the skin once a week.    [provider]  timolol (TIMOPTIC) 0.5 % ophthalmic solution 1 drop 2 (two) times daily.    [provider]    Family History Family History  Problem Relation Age of Onset  . Alcoholism Mother   . Heart attack Father   . Colon cancer Neg Hx   . Esophageal cancer Neg Hx   . Stomach cancer Neg Hx   . Rectal cancer Neg Hx     Social History Social History   Tobacco Use  . Smoking status: Never Smoker  . Smokeless tobacco: Never Used  Substance Use Topics  . Alcohol use: No  . Drug use: No     Allergies   Insulin lispro and Metformin and related   Review of Systems Review of Systems 10 Systems reviewed and are negative for acute change except as noted in the HPI.   Physical Exam Updated Vital Signs BP (!) 186/97   Pulse 89   Temp 98.1 F (36.7 C) (Rectal)   Resp 15   Ht 5\' 6"  (1.676 m)   Wt 55 kg   SpO2 98%   BMI 19.57 kg/m   Physical Exam Constitutional:      Comments: Patient is awake.  No respiratory distress.  He has a somewhat alert appearance but does not interact verbally.  HENT:     Head: Normocephalic and atraumatic.     Nose: Nose normal.     Mouth/Throat:     Comments: Cavity is moist.  No internal oral injury.  Patient is handling secretions without difficulty. Eyes:     Extraocular Movements: Extraocular movements intact.     Pupils: Pupils are equal, round, and reactive to light.  Neck:     Musculoskeletal: Neck supple.     Comments: Patient has not expressing any cervical spine tenderness to palpation. Cardiovascular:     Rate and Rhythm: Normal rate and regular rhythm.     Pulses: Normal pulses.     Heart sounds: Normal heart sounds.     Comments: No expression of chest wall pain to compression. Pulmonary:     Comments: Patient does not have respiratory distress.  Breath sounds are soft without gross wheeze or rhonchi Abdominal:     Comments:  Abdomen is soft and nondistended.  Patient does not express pain to palpation.  Genitourinary:    Penis: Normal.      Comments: No  swelling or erythema of the genitals Musculoskeletal:     Comments: Patient has significant pain to palpation over the left hip and lateral thigh.  There is diffuse erythema over the lateral thigh.  Able to put the legs through passive range of motion with hip flexion and flexion at the knees.  No obvious deformities of the lower extremities.  No peripheral edema.  No deformities of the upper extremities.  Patient was able to roll up onto his left side.  No contusions or abrasions to his back.  He did not endorse pain to palpation over the bony prominence of the spine or sacrum.  Skin:    General: Skin is warm and dry.  Neurological:     Comments: Patient is awake but not communicating verbally.  He makes some efforts at assisting with requests for examination.  He does have a pill-rolling tremor on the left.  Psychiatric:     Comments: Affect is flat.      ED Treatments / Results  Labs (all labs ordered are listed, but only abnormal results are displayed) Labs Reviewed  COMPREHENSIVE METABOLIC PANEL - Abnormal; Notable for the following components:      Result Value   CO2 21 (*)    Glucose, Bld 133 (*)    BUN 68 (*)    Creatinine, Ser 4.75 (*)    Total Protein 8.4 (*)    Alkaline Phosphatase 135 (*)    GFR calc non Af Amer 11 (*)    GFR calc Af Amer 13 (*)    Anion gap 17 (*)    All other components within normal limits  CBC WITH DIFFERENTIAL/PLATELET - Abnormal; Notable for the following components:   WBC 12.6 (*)    Neutro Abs 10.9 (*)    All other components within normal limits  URINALYSIS, ROUTINE W REFLEX MICROSCOPIC - Abnormal; Notable for the following components:   Glucose, UA >=500 (*)    Hgb urine dipstick LARGE (*)    Ketones, ur 20 (*)    Protein, ur >=300 (*)    Leukocytes, UA SMALL (*)    RBC / HPF >50 (*)    Bacteria, UA RARE  (*)    All other components within normal limits  I-STAT CG4 LACTIC ACID, ED - Abnormal; Notable for the following components:   Lactic Acid, Venous 3.14 (*)    All other components within normal limits  PROTIME-INR  CK  I-STAT CG4 LACTIC ACID, ED    EKG None  Radiology Dg Thoracic Spine 2 View  Result Date: 05/20/2018 CLINICAL DATA:  Weakness EXAM: THORACIC SPINE 2 VIEWS COMPARISON:  Chest x-ray 05/09/2014 FINDINGS: Minimal rightward curvature of the midthoracic spine. Vertebral body heights are maintained. Degenerative osteophytes of the spine. IMPRESSION: No acute osseous abnormality. Electronically Signed   By: Donavan Foil M.D.   On: 05/20/2018 19:13   Dg Lumbar Spine 2-3 Views  Result Date: 05/20/2018 CLINICAL DATA:  Weakness EXAM: LUMBAR SPINE - 2-3 VIEW COMPARISON:  None. FINDINGS: Vertebral body heights are maintained. Mild degenerative changes at L5-S1. IMPRESSION: Mild degenerative changes.  No acute osseous abnormality. Electronically Signed   By: Donavan Foil M.D.   On: 05/20/2018 19:14   Ct Head Wo Contrast  Result Date: 05/20/2018 CLINICAL DATA:  Dementia patient post unwitnessed fall. Head trauma, minor, GCS>=13, high clinical risk, initial exam; C-spine trauma, high clinical risk (NEXUS/CCR) EXAM: CT HEAD WITHOUT CONTRAST CT CERVICAL SPINE WITHOUT CONTRAST TECHNIQUE: Multidetector CT imaging of the head and  cervical spine was performed following the standard protocol without intravenous contrast. Multiplanar CT image reconstructions of the cervical spine were also generated. COMPARISON:  Head and cervical spine CT 11/15/2013 FINDINGS: CT HEAD FINDINGS Brain: Unchanged atrophy and chronic small vessel ischemia. No intracranial hemorrhage, mass effect, or midline shift. No hydrocephalus. The basilar cisterns are patent. No evidence of territorial infarct or acute ischemia. No extra-axial or intracranial fluid collection. Vascular: Atherosclerosis of skullbase vasculature without  hyperdense vessel or abnormal calcification. Skull: No fracture or focal lesion. Sinuses/Orbits: No acute findings. Minimal chronic debris in left side of sphenoid sinus. Bilateral cataract resection. Other: None. CT CERVICAL SPINE FINDINGS Alignment: Slight exaggerated cervical lordosis, no traumatic subluxation. Skull base and vertebrae: No acute fracture. The dens and skull base are intact. Heterogeneous marrow appearance is unchanged from prior exam and likely combination of osteopenia and degenerative change. Stable subcentimeter benign lucency within posterior C6 vertebral body. Soft tissues and spinal canal: No prevertebral fluid or swelling. No visible canal hematoma. Disc levels: Stable multilevel degenerative disc disease and facet arthropathy. Upper chest: Negative. Other: None. IMPRESSION: 1. No acute intracranial abnormality. No skull fracture. Stable atrophy and chronic small vessel ischemia. 2. Stable degenerative change in the cervical spine without acute fracture or subluxation. Electronically Signed   By: Keith Rake M.D.   On: 05/20/2018 18:55   Ct Cervical Spine Wo Contrast  Result Date: 05/20/2018 CLINICAL DATA:  Dementia patient post unwitnessed fall. Head trauma, minor, GCS>=13, high clinical risk, initial exam; C-spine trauma, high clinical risk (NEXUS/CCR) EXAM: CT HEAD WITHOUT CONTRAST CT CERVICAL SPINE WITHOUT CONTRAST TECHNIQUE: Multidetector CT imaging of the head and cervical spine was performed following the standard protocol without intravenous contrast. Multiplanar CT image reconstructions of the cervical spine were also generated. COMPARISON:  Head and cervical spine CT 11/15/2013 FINDINGS: CT HEAD FINDINGS Brain: Unchanged atrophy and chronic small vessel ischemia. No intracranial hemorrhage, mass effect, or midline shift. No hydrocephalus. The basilar cisterns are patent. No evidence of territorial infarct or acute ischemia. No extra-axial or intracranial fluid collection.  Vascular: Atherosclerosis of skullbase vasculature without hyperdense vessel or abnormal calcification. Skull: No fracture or focal lesion. Sinuses/Orbits: No acute findings. Minimal chronic debris in left side of sphenoid sinus. Bilateral cataract resection. Other: None. CT CERVICAL SPINE FINDINGS Alignment: Slight exaggerated cervical lordosis, no traumatic subluxation. Skull base and vertebrae: No acute fracture. The dens and skull base are intact. Heterogeneous marrow appearance is unchanged from prior exam and likely combination of osteopenia and degenerative change. Stable subcentimeter benign lucency within posterior C6 vertebral body. Soft tissues and spinal canal: No prevertebral fluid or swelling. No visible canal hematoma. Disc levels: Stable multilevel degenerative disc disease and facet arthropathy. Upper chest: Negative. Other: None. IMPRESSION: 1. No acute intracranial abnormality. No skull fracture. Stable atrophy and chronic small vessel ischemia. 2. Stable degenerative change in the cervical spine without acute fracture or subluxation. Electronically Signed   By: Keith Rake M.D.   On: 05/20/2018 18:55   Dg Pelvis Portable  Result Date: 05/20/2018 CLINICAL DATA:  Weakness EXAM: PORTABLE PELVIS 1-2 VIEWS COMPARISON:  None. FINDINGS: Vascular calcifications. Pubic symphysis and rami are intact. SI joints are non widened. No fracture or malalignment. Mild degenerative changes. IMPRESSION: No acute osseous abnormality Electronically Signed   By: Donavan Foil M.D.   On: 05/20/2018 19:12   Dg Chest Port 1 View  Result Date: 05/20/2018 CLINICAL DATA:  Fall EXAM: PORTABLE CHEST 1 VIEW COMPARISON:  05/09/2014 FINDINGS: No acute consolidation  or effusion. Normal cardiomediastinal silhouette with aortic atherosclerosis. No pneumothorax. IMPRESSION: No active disease. Electronically Signed   By: Donavan Foil M.D.   On: 05/20/2018 19:11    Procedures Procedures (including critical care  time)  Medications Ordered in ED Medications  0.9 %  sodium chloride infusion ( Intravenous New Bag/Given (Non-Interop) 05/20/18 1904)  sodium chloride 0.9 % bolus 500 mL (0 mLs Intravenous Stopped 05/20/18 2026)     Initial Impression / Assessment and Plan / ED Course  I have reviewed the triage vital signs and the nursing notes.  Pertinent labs & imaging results that were available during my care of the patient were reviewed by me and considered in my medical decision making (see chart for details).    Patient presents with decreased activity level and poor oral intake.  He does have known Parkinson's disease.  He had a recent fall with increased complaints of back pain per his wife.  At this time, x-rays do not identify acute fracture.  Patient can roll onto his side and is not endorsing pain to palpation of the bony prominences.  At baseline, his wife reports his activities are very limited but she does indicate that he can get out of bed and come to the table for meals.  Patient has been very quiet and limited verbal interactions during my evaluation.  Staff have reported there have been slight increased amounts of interaction exhibiting orientation to the situation.  At this time, patient has acute kidney injury.  This may very well be due to dehydration.  His wife reports he is not eating or drinking or getting out of bed.  Straight cath yielded 250 cc of urine in the bladder.  He does not have abdominal pain to palpation.  Plan will be for admission for definitive treatment and additional diagnostic studies if indicated. Final Clinical Impressions(s) / ED Diagnoses   Final diagnoses:  Acute renal failure, unspecified acute renal failure type ([place])  Dehydration  Parkinson's disease [facility])    ED Discharge Orders    None       Charlesetta Shanks, MD 05/20/18 2156

## 2018-05-21 ENCOUNTER — Observation Stay (HOSPITAL_COMMUNITY): Payer: Medicare HMO

## 2018-05-21 DIAGNOSIS — N179 Acute kidney failure, unspecified: Secondary | ICD-10-CM | POA: Diagnosis not present

## 2018-05-21 LAB — GLUCOSE, CAPILLARY
GLUCOSE-CAPILLARY: 189 mg/dL — AB (ref 70–99)
Glucose-Capillary: 151 mg/dL — ABNORMAL HIGH (ref 70–99)
Glucose-Capillary: 154 mg/dL — ABNORMAL HIGH (ref 70–99)
Glucose-Capillary: 158 mg/dL — ABNORMAL HIGH (ref 70–99)
Glucose-Capillary: 92 mg/dL (ref 70–99)
Glucose-Capillary: 99 mg/dL (ref 70–99)

## 2018-05-21 LAB — COMPREHENSIVE METABOLIC PANEL
ALT: 16 U/L (ref 0–44)
AST: 22 U/L (ref 15–41)
Albumin: 4 g/dL (ref 3.5–5.0)
Alkaline Phosphatase: 122 U/L (ref 38–126)
Anion gap: 13 (ref 5–15)
BUN: 67 mg/dL — ABNORMAL HIGH (ref 8–23)
CO2: 20 mmol/L — ABNORMAL LOW (ref 22–32)
Calcium: 8.7 mg/dL — ABNORMAL LOW (ref 8.9–10.3)
Chloride: 112 mmol/L — ABNORMAL HIGH (ref 98–111)
Creatinine, Ser: 4.42 mg/dL — ABNORMAL HIGH (ref 0.61–1.24)
GFR calc non Af Amer: 12 mL/min — ABNORMAL LOW (ref >60)
GFR, EST AFRICAN AMERICAN: 14 mL/min — AB (ref >60)
Glucose, Bld: 105 mg/dL — ABNORMAL HIGH (ref 70–99)
Potassium: 4.7 mmol/L (ref 3.5–5.1)
Sodium: 145 mmol/L (ref 135–145)
Total Bilirubin: 0.8 mg/dL (ref 0.3–1.2)
Total Protein: 7.6 g/dL (ref 6.5–8.1)

## 2018-05-21 LAB — SODIUM, URINE, RANDOM: Sodium, Ur: 48 mmol/L

## 2018-05-21 LAB — CBC
HCT: 44.4 % (ref 39.0–52.0)
Hemoglobin: 14.2 g/dL (ref 13.0–17.0)
MCH: 29.1 pg (ref 26.0–34.0)
MCHC: 32 g/dL (ref 30.0–36.0)
MCV: 91 fL (ref 80.0–100.0)
NRBC: 0 % (ref 0.0–0.2)
Platelets: 201 10*3/uL (ref 150–400)
RBC: 4.88 MIL/uL (ref 4.22–5.81)
RDW: 14.4 % (ref 11.5–15.5)
WBC: 11.6 10*3/uL — ABNORMAL HIGH (ref 4.0–10.5)

## 2018-05-21 LAB — HEMOGLOBIN A1C
Hgb A1c MFr Bld: 4.9 % (ref 4.8–5.6)
Mean Plasma Glucose: 93.93 mg/dL

## 2018-05-21 LAB — LACTIC ACID, PLASMA: Lactic Acid, Venous: 1.7 mmol/L (ref 0.5–1.9)

## 2018-05-21 LAB — TSH: TSH: 1.796 u[IU]/mL (ref 0.350–4.500)

## 2018-05-21 LAB — PHOSPHORUS: Phosphorus: 5.2 mg/dL — ABNORMAL HIGH (ref 2.5–4.6)

## 2018-05-21 LAB — PROCALCITONIN: Procalcitonin: 0.22 ng/mL

## 2018-05-21 LAB — CREATININE, URINE, RANDOM: Creatinine, Urine: 72.59 mg/dL

## 2018-05-21 LAB — PREALBUMIN: Prealbumin: 25.1 mg/dL (ref 18–38)

## 2018-05-21 LAB — MAGNESIUM: Magnesium: 2.1 mg/dL (ref 1.7–2.4)

## 2018-05-21 MED ORDER — HYDROCODONE-ACETAMINOPHEN 5-325 MG PO TABS
1.0000 | ORAL_TABLET | ORAL | Status: DC | PRN
  Administered 2018-05-22: 1 via ORAL
  Filled 2018-05-21: qty 1

## 2018-05-21 MED ORDER — ENOXAPARIN SODIUM 30 MG/0.3ML ~~LOC~~ SOLN
30.0000 mg | SUBCUTANEOUS | Status: DC
  Administered 2018-05-21 – 2018-05-26 (×6): 30 mg via SUBCUTANEOUS
  Filled 2018-05-21 (×6): qty 0.3

## 2018-05-21 MED ORDER — SODIUM CHLORIDE 0.9 % IV SOLN
INTRAVENOUS | Status: AC
  Administered 2018-05-21 (×2): via INTRAVENOUS

## 2018-05-21 MED ORDER — OXYBUTYNIN CHLORIDE 5 MG PO TABS
5.0000 mg | ORAL_TABLET | Freq: Every day | ORAL | Status: DC
  Administered 2018-05-21 – 2018-05-22 (×2): 5 mg via ORAL
  Filled 2018-05-21 (×2): qty 1

## 2018-05-21 MED ORDER — HYDRALAZINE HCL 20 MG/ML IJ SOLN
10.0000 mg | INTRAMUSCULAR | Status: DC | PRN
  Administered 2018-05-21 – 2018-05-23 (×2): 10 mg via INTRAVENOUS
  Filled 2018-05-21 (×2): qty 1

## 2018-05-21 MED ORDER — ACETAMINOPHEN 325 MG PO TABS
650.0000 mg | ORAL_TABLET | Freq: Four times a day (QID) | ORAL | Status: DC | PRN
  Administered 2018-05-23: 650 mg via ORAL
  Filled 2018-05-21: qty 2

## 2018-05-21 MED ORDER — CARBIDOPA-LEVODOPA 10-100 MG PO TABS
1.0000 | ORAL_TABLET | Freq: Three times a day (TID) | ORAL | Status: DC
  Administered 2018-05-21 – 2018-05-26 (×14): 1 via ORAL
  Filled 2018-05-21 (×20): qty 1

## 2018-05-21 MED ORDER — SODIUM CHLORIDE 0.9 % IV SOLN
1.0000 g | INTRAVENOUS | Status: DC
  Administered 2018-05-21: 1 g via INTRAVENOUS
  Filled 2018-05-21: qty 10
  Filled 2018-05-21: qty 1

## 2018-05-21 MED ORDER — THIAMINE HCL 100 MG/ML IJ SOLN
100.0000 mg | Freq: Every day | INTRAMUSCULAR | Status: DC
  Administered 2018-05-21 – 2018-05-22 (×2): 100 mg via INTRAVENOUS
  Filled 2018-05-21 (×2): qty 2

## 2018-05-21 MED ORDER — LATANOPROST 0.005 % OP SOLN
1.0000 [drp] | Freq: Every day | OPHTHALMIC | Status: DC
  Administered 2018-05-21 – 2018-05-26 (×6): 1 [drp] via OPHTHALMIC
  Filled 2018-05-21: qty 2.5

## 2018-05-21 MED ORDER — ACETAMINOPHEN 650 MG RE SUPP: 650.0000 mg | Freq: Four times a day (QID) | RECTAL | Status: DC | PRN

## 2018-05-21 MED ORDER — ONDANSETRON HCL 4 MG PO TABS: 4.0000 mg | ORAL_TABLET | Freq: Four times a day (QID) | ORAL | Status: DC | PRN

## 2018-05-21 MED ORDER — SODIUM CHLORIDE 0.9 % IV SOLN
INTRAVENOUS | Status: DC
  Administered 2018-05-21 – 2018-05-23 (×4): via INTRAVENOUS

## 2018-05-21 MED ORDER — LEVALBUTEROL HCL 0.63 MG/3ML IN NEBU: 0.6300 mg | INHALATION_SOLUTION | Freq: Four times a day (QID) | RESPIRATORY_TRACT | Status: DC | PRN

## 2018-05-21 MED ORDER — TIMOLOL MALEATE 0.5 % OP SOLN
1.0000 [drp] | Freq: Two times a day (BID) | OPHTHALMIC | Status: DC
  Administered 2018-05-21 – 2018-05-27 (×13): 1 [drp] via OPHTHALMIC
  Filled 2018-05-21: qty 5

## 2018-05-21 MED ORDER — BRIMONIDINE TARTRATE 0.15 % OP SOLN
1.0000 [drp] | Freq: Three times a day (TID) | OPHTHALMIC | Status: DC
  Administered 2018-05-21 – 2018-05-27 (×19): 1 [drp] via OPHTHALMIC
  Filled 2018-05-21: qty 5

## 2018-05-21 MED ORDER — ONDANSETRON HCL 4 MG/2ML IJ SOLN
4.0000 mg | Freq: Four times a day (QID) | INTRAMUSCULAR | Status: DC | PRN
  Administered 2018-05-24 – 2018-05-25 (×3): 4 mg via INTRAVENOUS
  Filled 2018-05-21 (×3): qty 2

## 2018-05-21 NOTE — Progress Notes (Signed)
[facility] PROGRESS NOTE  DRAIDEN MIRSKY BZJ:[id number] DOB: 03/14/1941 DOA: 05/20/2018 PCP: Donita Brooks, MD   Narrative:  78 year old male At baseline bedbound frequent falls Parkinson's disease Dementia Diabetes mellitus without complication -Found on floor down 3 PM but might of been down prior to that Has been having significant back pain unclear how long he has been down  Emergency room work-up suggestive of UTI-x-rays of the back negative for infection Also found to have acute kidney injury with creatinine in the 4 range as baseline is usually below 1  A & Plan Acute metabolic encephalopathy multifactorial but most likely secondary to uremia-do not feel at this time needs MRI he is nonfocal and his mental status appears to be improving although he still little bit slow medications that he is on at home include gabapentin which will be discontinued as this can cause metabolic encephalopathy in addition to Ditropan  Acute kidney injury-on metformin 500 daily which will need to be discontinued ongoing in addition to losartan 25 daily which has been held-continue IV fluids at 100 cc/h going forward-bladder scan-keep indwelling Foley-obtaining urine sodium and urine creatinine I had a brief discussion with the daughter and do not think he would be a great candidate for dialysis if his kidney failure proves to be chronic  Excoriation to tip of penis-usually wears depends and sits in urine for periods of time on hand-Place Foley catheter after bladder scan and reevaluate  ?  UTI-patient had small leukocytes negative nitrites but 300 proteinuria-I do not think he has a urinary infection so we will discontinue antibiotics once preliminary cultures are negative  Probable dysphagia-continue dysphagia 3 diet  Peripheral neuropathy-holding gabapentin as above  Parkinson's disease with dementia-on Sinemet 1 tab 3 times daily which we will continue  Reflux discontinue  Prilosec  Hyperlipidemia holding statin at this time  Malnutrition BMI 18  Heparin, DNR, long discussion with daughter at bedside detailing plan of care in addition to possible outcomes Inpatient   Frost Deitrick, MD  [facility] Direct contact: [phone] --Via amion app OR  --[email or web address]; password [email or web address]  7PM-7AM contact night coverage as above 05/21/2018, 11:58 AM  LOS: 0 days   Consultants:  None yet  Procedures:  None  Antimicrobials: No  Interval history/Subjective: Awake when agitated sitting in the chair was able to eat breakfast according to nursing Nursing also alerted me to him having excoriation on the penis No chest pain Fever but review of systems is limited and circumscribed  Objective:  Vitals:  Vitals:   05/21/18 0852 05/21/18 1116  BP: 139/74 (!) 145/81  Pulse:  (!) 104  Resp:  16  Temp:  98 F (36.7 C)  SpO2:  95%    Exam:  EOMI NCAT no distress Chest is clinically clear no added sound S1-S2 no murmur rub or gallop No thyromegaly or submandibular lymphadenopathy Abdomen soft nontender no mass no organomegaly Lower extremities are soft   I have personally reviewed the following:  DATA   Labs:  Bicarb 20 BUN/creatinine 67/4.4 phosphorus 5.2 GFR 12 LFTs normal  Lactic acid 1.7 procalcitonin 0.2  WBC 11.6  Imaging studies:  none  Medical tests:  n   Test discussed with performing physician:  n  Decision to obtain old records:  n  Review and summation of old records:  yy  Scheduled Meds: . brimonidine  1 drop Both Eyes TID  . carbidopa-levodopa  1 tablet Oral TID  . enoxaparin (LOVENOX) injection  30 mg Subcutaneous Q24H  .  latanoprost  1 drop Both Eyes QHS  . oxybutynin  5 mg Oral Daily  . thiamine injection  100 mg Intravenous Daily  . timolol  1 drop Both Eyes BID   Continuous Infusions: . cefTRIAXone (ROCEPHIN)  IV      Active Problems:   Encephalopathy acute   DM type 2 (diabetes mellitus, type  2) ([facility])   UTI (urinary tract infection)   Dementia due to Parkinson's disease with behavioral disturbance ([place])   AKI (acute kidney injury) ([place])   Dehydration   Malnutrition ([place])   LOS: 0 days

## 2018-05-21 NOTE — Evaluation (Signed)
Physical Therapy Evaluation Patient Details Name: Stephen Frost MRN: [id number] DOB: 1941/02/03 Today's Date: 05/21/2018   History of Present Illness  Pt admitted s/p falls with back pain, UTI and dehydration.  Pt with hx of DM, Parkinsons and dementia  Clinical Impression  Pt admitted as above and presenting with functional mobility limitations 2* generalized weakness, balance deficits, and dementia related cognition.  Pt currently requiring significant assist to perform basic mobility tasks and would benefit from follow up rehab at SNF level.    Follow Up Recommendations SNF    Equipment Recommendations  None recommended by PT    Recommendations for Other Services       Precautions / Restrictions Precautions Precautions: Fall Restrictions Weight Bearing Restrictions: No      Mobility  Bed Mobility Overal bed mobility: Needs Assistance Bed Mobility: Supine to Sit     Supine to sit: Min assist;Mod assist;+2 for physical assistance;+2 for safety/equipment     General bed mobility comments: assist to initiate roll, to bring legs over EOB and to transition to sitting  Transfers Overall transfer level: Needs assistance Equipment used: Rolling walker (2 wheeled) Transfers: Sit to/from Stand Sit to Stand: Min assist;Mod assist;+2 physical assistance;+2 safety/equipment;From elevated surface         General transfer comment: physical cues to inititate pt participation.  Physical assist to bring wt up and fwd and to balance in initial standing.  Ambulation/Gait Ambulation/Gait assistance: Min assist;Mod assist;+2 safety/equipment Gait Distance (Feet): 10 Feet Assistive device: Rolling walker (2 wheeled) Gait Pattern/deviations: Decreased step length - right;Decreased step length - left;Shuffle;Step-to pattern;Step-through pattern;Trunk flexed Gait velocity: decr   General Gait Details: short shuffling gait with assist needed for balance/support and RW management -  distance ltd by incontinence  Stairs            Wheelchair Mobility    Modified Rankin (Stroke Patients Only)       Balance Overall balance assessment: Needs assistance Sitting-balance support: No upper extremity supported;Feet supported Sitting balance-Leahy Scale: Good     Standing balance support: Bilateral upper extremity supported Standing balance-Leahy Scale: Poor                               Pertinent Vitals/Pain Pain Assessment: Faces Faces Pain Scale: Hurts little more Pain Location: (Pt winced once but unable to tell where it hurt)    Home Living Family/patient expects to be discharged to:: Private residence Living Arrangements: Other (Comment)(Ex wife) Available Help at Discharge: Other (Comment)(HHA one hr a day while ex wife is at work) Type of Home: House Home Access: Level entry     [place]: [place]: Environmental consultant - 2 wheels      Prior Function Level of Independence: Needs assistance   Gait / Transfers Assistance Needed: Ex wife reports pt uses RW for limited ambulation           Hand Dominance        Extremity/Trunk Assessment   Upper Extremity Assessment Upper Extremity Assessment: Generalized weakness    Lower Extremity Assessment Lower Extremity Assessment: Generalized weakness       Communication   Communication: Other (comment)(INtermittently responds to questions/cues)  Cognition Arousal/Alertness: Awake/alert Behavior During Therapy: Flat affect Overall Cognitive Status: History of cognitive impairments - at baseline  General Comments: Pt not following VC but will participate with physical initiation of movement      General Comments      Exercises     Assessment/Plan    PT Assessment Patient needs continued PT services  PT Problem List Decreased strength;Decreased activity tolerance;Decreased balance;Decreased mobility;Decreased  cognition;Decreased knowledge of use of DME;Pain;Decreased safety awareness       PT Treatment Interventions DME instruction;Gait training;Stair training;Functional mobility training;Therapeutic activities;Therapeutic exercise;Patient/family education;Balance training    PT Goals (Current goals can be found in the Care Plan section)  Acute Rehab PT Goals Patient Stated Goal: No goals stated PT Goal Formulation: With patient Time For Goal Achievement: 06/04/18 Potential to Achieve Goals: Fair    Frequency Min 3X/week   Barriers to discharge Decreased caregiver support Ex wife works full time    Co-evaluation               AM-PAC PT "6 Clicks" Mobility  Outcome Measure Help needed turning from your back to your side while in a flat bed without using bedrails?: A Lot Help needed moving from lying on your back to sitting on the side of a flat bed without using bedrails?: A Lot Help needed moving to and from a bed to a chair (including a wheelchair)?: A Lot Help needed standing up from a chair using your arms (e.g., wheelchair or bedside chair)?: A Lot Help needed to walk in hospital room?: A Lot Help needed climbing 3-5 steps with a railing? : Total 6 Click Score: 11    End of Session Equipment Utilized During Treatment: Gait belt Activity Tolerance: Patient limited by fatigue;Other (comment) Patient left: in chair;with call bell/phone within reach;with nursing/sitter in room;with chair alarm set Nurse Communication: Mobility status PT Visit Diagnosis: Difficulty in walking, not elsewhere classified (R26.2);Muscle weakness (generalized) (M62.81)    Time: 3716-9678 PT Time Calculation (min) (ACUTE ONLY): 27 min   Charges:   PT Evaluation $PT Eval Low Complexity: 1 Low PT Treatments $Gait Training: 8-22 mins        [place] Pager [phone] Office [phone]   Freya Zobrist 05/21/2018, 1:28 PM

## 2018-05-21 NOTE — Progress Notes (Signed)
16 Fr Foley cath inserted per order. Pt jumped and moved during insertion resulting in pink tinged urine and bleeding  from urethra after insertion. Will continue to monitor. Eulas Post, RN

## 2018-05-21 NOTE — Evaluation (Signed)
Clinical/Bedside Swallow Evaluation Patient Details  Name: Stephen Frost MRN: [id number] Date of Birth: [birth year]  Today's Date: 05/21/2018 Time: SLP Start Time (ACUTE ONLY): 4854 SLP Stop Time (ACUTE ONLY): 1312 SLP Time Calculation (min) (ACUTE ONLY): 28 min  Past Medical History:  Past Medical History:  Diagnosis Date  . Acid reflux   . Arthritis    in spine  . Cataract 2012   bilateral eye   . Dementia ([place]) 05/20/2018   Per EMS and caregiver  . Diabetes mellitus without complication ([place])    type 2  . Elevated cholesterol   . Melanoma ([place])    foot  . Pneumonia    Past Surgical History:  Past Surgical History:  Procedure Laterality Date  . CATARACT EXTRACTION, BILATERAL    . EYE SURGERY    . GLAUCOMA SURGERY    . HERNIA REPAIR     double  . TONSILLECTOMY     HPI:  78 year old male with h/o Parkinson's disease, dementia, admitted wtih acute metabolic encephalopathy multifactorial but most likely secondary to uremia. Patient had an OP MBS 02/2018-moderate oropharyngeal dysphagia with silent aspiration of thin liquids, large Zenkers diverticulum with retrograde flow of thin liquids to the pyriform sinuses. Regular solids, nectar thick liquid recommended, meds crushed in puree.    Assessment / Plan / Recommendation Clinical Impression  Patient presents with evidence of a moderate oropharyngeal dysphagia, somewhat consistant with MBS results in 02/2018. Oral transit of solids prolonged and with anterior sulci pocketing requiring SLP assistance with follow up boluses of pureed solid and nectar thick liquid. Thin liquid intake resulted in a delayed cough suggestive of aspiration event, while nectar thick liquids consumed with little-no indication of aspiration (throat clear x1 ). Discussed patient performance and history with patient's wife via phone. Following MBS in October, patient declined use of thickened liquids. In light of decline in overall function, including  mentation, which has likely further slowed swallow initiation increasing aspiration risk, recommend at least short term use of nectar thick liquids to mitigate aspiration risk. As patient returns to baseline, plan to discuss further with patient and family plan for long term diet which will likely be return to thin liquids with known risk of aspiration.  SLP Visit Diagnosis: Dysphagia, oropharyngeal phase (R13.12)    Aspiration Risk  Moderate aspiration risk    Diet Recommendation Dysphagia 2 (Fine chop);Nectar-thick liquid   Liquid Administration via: Cup;No straw Medication Administration: Crushed with puree Supervision: Patient able to self feed;Full supervision/cueing for compensatory strategies Compensations: Slow rate;Small sips/bites Postural Changes: Seated upright at 90 degrees    Other  Recommendations Oral Care Recommendations: Oral care BID Other Recommendations: Order thickener from pharmacy;Prohibited food (jello, ice cream, thin soups);Remove water pitcher   Follow up Recommendations Other (comment)(TBD)      Frequency and Duration min 2x/week  1 week       Prognosis Prognosis for Safe Diet Advancement: Fair Barriers to Reach Goals: Cognitive deficits      Swallow Study   General HPI: 78 year old male with h/o Parkinson's disease, dementia, admitted wtih acute metabolic encephalopathy multifactorial but most likely secondary to uremia. Patient had an OP MBS 02/2018-moderate oropharyngeal dysphagia with silent aspiration of thin liquids, large Zenkers diverticulum with retrograde flow of thin liquids to the pyriform sinuses. Regular solids, nectar thick liquid recommended, meds crushed in puree.  Type of Study: Bedside Swallow Evaluation Previous Swallow Assessment: see HPI Diet Prior to this Study: Dysphagia 3 (soft);Thin liquids Temperature  Spikes Noted: No Respiratory Status: Room air History of Recent Intubation: No Behavior/Cognition: Lethargic/Drowsy;Pleasant  mood;Requires cueing;Doesn't follow directions;Distractible Oral Cavity Assessment: Within Functional Limits Oral Care Completed by SLP: No Oral Cavity - Dentition: Adequate natural dentition Vision: Functional for self-feeding Self-Feeding Abilities: Able to feed self Patient Positioning: Upright in chair Baseline Vocal Quality: Normal Volitional Cough: Cognitively unable to elicit Volitional Swallow: Unable to elicit    Oral/Motor/Sensory Function Overall Oral Motor/Sensory Function: Within functional limits   Ice Chips Ice chips: Not tested   Thin Liquid Thin Liquid: Impaired Presentation: Cup;Self Fed Pharyngeal  Phase Impairments: Cough - Delayed    Nectar Thick Nectar Thick Liquid: Impaired Presentation: Cup;Self Fed Pharyngeal Phase Impairments: Throat Clearing - Immediate   Honey Thick Honey Thick Liquid: Not tested   Puree Puree: Within functional limits Presentation: Spoon   Solid     Solid: Impaired Presentation: Self Fed Oral Phase Impairments: Impaired mastication Oral Phase Functional Implications: Impaired mastication;Oral residue;Oral holding;Prolonged oral transit(anterior sulci pocketing)     Zamiah Tollett MA, CCC-SLP   Jayce Kainz Meryl 05/21/2018,1:30 PM

## 2018-05-22 DIAGNOSIS — R296 Repeated falls: Secondary | ICD-10-CM | POA: Diagnosis present

## 2018-05-22 DIAGNOSIS — N179 Acute kidney failure, unspecified: Secondary | ICD-10-CM | POA: Diagnosis present

## 2018-05-22 DIAGNOSIS — N183 Chronic kidney disease, stage 3 (moderate): Secondary | ICD-10-CM | POA: Diagnosis present

## 2018-05-22 DIAGNOSIS — G9340 Encephalopathy, unspecified: Secondary | ICD-10-CM | POA: Diagnosis not present

## 2018-05-22 DIAGNOSIS — N390 Urinary tract infection, site not specified: Secondary | ICD-10-CM | POA: Diagnosis present

## 2018-05-22 DIAGNOSIS — E46 Unspecified protein-calorie malnutrition: Secondary | ICD-10-CM | POA: Diagnosis present

## 2018-05-22 DIAGNOSIS — H9110 Presbycusis, unspecified ear: Secondary | ICD-10-CM | POA: Diagnosis present

## 2018-05-22 DIAGNOSIS — F0281 Dementia in other diseases classified elsewhere with behavioral disturbance: Secondary | ICD-10-CM | POA: Diagnosis present

## 2018-05-22 DIAGNOSIS — G9341 Metabolic encephalopathy: Secondary | ICD-10-CM | POA: Diagnosis present

## 2018-05-22 DIAGNOSIS — W06XXXA Fall from bed, initial encounter: Secondary | ICD-10-CM | POA: Diagnosis present

## 2018-05-22 DIAGNOSIS — G20 Parkinson's disease: Secondary | ICD-10-CM | POA: Diagnosis present

## 2018-05-22 DIAGNOSIS — M1990 Unspecified osteoarthritis, unspecified site: Secondary | ICD-10-CM | POA: Diagnosis present

## 2018-05-22 DIAGNOSIS — Z515 Encounter for palliative care: Secondary | ICD-10-CM | POA: Diagnosis present

## 2018-05-22 DIAGNOSIS — Z66 Do not resuscitate: Secondary | ICD-10-CM | POA: Diagnosis present

## 2018-05-22 DIAGNOSIS — Z85820 Personal history of malignant melanoma of skin: Secondary | ICD-10-CM | POA: Diagnosis not present

## 2018-05-22 DIAGNOSIS — J690 Pneumonitis due to inhalation of food and vomit: Secondary | ICD-10-CM | POA: Diagnosis not present

## 2018-05-22 DIAGNOSIS — E7800 Pure hypercholesterolemia, unspecified: Secondary | ICD-10-CM | POA: Diagnosis present

## 2018-05-22 DIAGNOSIS — Z888 Allergy status to other drugs, medicaments and biological substances status: Secondary | ICD-10-CM | POA: Diagnosis not present

## 2018-05-22 DIAGNOSIS — K219 Gastro-esophageal reflux disease without esophagitis: Secondary | ICD-10-CM | POA: Diagnosis present

## 2018-05-22 DIAGNOSIS — E785 Hyperlipidemia, unspecified: Secondary | ICD-10-CM | POA: Diagnosis present

## 2018-05-22 DIAGNOSIS — Y92003 Bedroom of unspecified non-institutional (private) residence as the place of occurrence of the external cause: Secondary | ICD-10-CM | POA: Diagnosis not present

## 2018-05-22 DIAGNOSIS — Z794 Long term (current) use of insulin: Secondary | ICD-10-CM | POA: Diagnosis not present

## 2018-05-22 DIAGNOSIS — Z7189 Other specified counseling: Secondary | ICD-10-CM | POA: Diagnosis not present

## 2018-05-22 DIAGNOSIS — E860 Dehydration: Secondary | ICD-10-CM | POA: Diagnosis present

## 2018-05-22 DIAGNOSIS — R1310 Dysphagia, unspecified: Secondary | ICD-10-CM | POA: Diagnosis present

## 2018-05-22 DIAGNOSIS — E1136 Type 2 diabetes mellitus with diabetic cataract: Secondary | ICD-10-CM | POA: Diagnosis present

## 2018-05-22 DIAGNOSIS — Z681 Body mass index (BMI) 19 or less, adult: Secondary | ICD-10-CM | POA: Diagnosis not present

## 2018-05-22 DIAGNOSIS — E1122 Type 2 diabetes mellitus with diabetic chronic kidney disease: Secondary | ICD-10-CM | POA: Diagnosis present

## 2018-05-22 LAB — GLUCOSE, CAPILLARY
Glucose-Capillary: 112 mg/dL — ABNORMAL HIGH (ref 70–99)
Glucose-Capillary: 109 mg/dL — ABNORMAL HIGH (ref 70–99)
Glucose-Capillary: 150 mg/dL — ABNORMAL HIGH (ref 70–99)
Glucose-Capillary: 134 mg/dL — ABNORMAL HIGH (ref 70–99)
Glucose-Capillary: 96 mg/dL (ref 70–99)

## 2018-05-22 LAB — CBC WITH DIFFERENTIAL/PLATELET
Abs Immature Granulocytes: 0.02 10*3/uL (ref 0.00–0.07)
BASOS ABS: 0 10*3/uL (ref 0.0–0.1)
Basophils Relative: 0 %
Eosinophils Absolute: 0.3 10*3/uL (ref 0.0–0.5)
Eosinophils Relative: 4 %
HEMATOCRIT: 34.7 % — AB (ref 39.0–52.0)
Hemoglobin: 11 g/dL — ABNORMAL LOW (ref 13.0–17.0)
Immature Granulocytes: 0 %
Lymphocytes Relative: 18 %
Lymphs Abs: 1.5 10*3/uL (ref 0.7–4.0)
MCH: 29.4 pg (ref 26.0–34.0)
MCHC: 31.7 g/dL (ref 30.0–36.0)
MCV: 92.8 fL (ref 80.0–100.0)
Monocytes Absolute: 0.7 10*3/uL (ref 0.1–1.0)
Monocytes Relative: 9 %
Neutro Abs: 5.6 10*3/uL (ref 1.7–7.7)
Neutrophils Relative %: 69 %
Platelets: 180 10*3/uL (ref 150–400)
RBC: 3.74 MIL/uL — ABNORMAL LOW (ref 4.22–5.81)
RDW: 14.7 % (ref 11.5–15.5)
WBC: 8.1 10*3/uL (ref 4.0–10.5)
nRBC: 0 % (ref 0.0–0.2)

## 2018-05-22 LAB — BASIC METABOLIC PANEL
ANION GAP: 9 (ref 5–15)
BUN: 69 mg/dL — ABNORMAL HIGH (ref 8–23)
CO2: 17 mmol/L — ABNORMAL LOW (ref 22–32)
Calcium: 7.9 mg/dL — ABNORMAL LOW (ref 8.9–10.3)
Chloride: 115 mmol/L — ABNORMAL HIGH (ref 98–111)
Creatinine, Ser: 4.3 mg/dL — ABNORMAL HIGH (ref 0.61–1.24)
GFR calc Af Amer: 15 mL/min — ABNORMAL LOW (ref >60)
GFR calc non Af Amer: 13 mL/min — ABNORMAL LOW (ref >60)
Glucose, Bld: 119 mg/dL — ABNORMAL HIGH (ref 70–99)
Potassium: 4.9 mmol/L (ref 3.5–5.1)
Sodium: 141 mmol/L (ref 135–145)

## 2018-05-22 LAB — PSA: Prostatic Specific Antigen: 2.07 ng/mL (ref 0.00–4.00)

## 2018-05-22 MED ORDER — VITAMIN B-1 100 MG PO TABS
100.0000 mg | ORAL_TABLET | Freq: Every day | ORAL | Status: DC
  Administered 2018-05-23 – 2018-05-26 (×4): 100 mg via ORAL
  Filled 2018-05-22 (×4): qty 1

## 2018-05-22 MED ORDER — HYDROCODONE-ACETAMINOPHEN 5-325 MG PO TABS: 1.0000 | ORAL_TABLET | ORAL | Status: DC | PRN

## 2018-05-22 NOTE — Progress Notes (Signed)
Initial Nutrition Assessment  INTERVENTION:   -Continue Magic cup TID with meals, each supplement provides 290 kcal and 9 grams of protein -Provide [employer] Vital Cuisine shake daily, provides 500 kcal and 22g protein.  NUTRITION DIAGNOSIS:   Inadequate oral intake related to dysphagia as evidenced by per patient/family report.  GOAL:   Patient will meet greater than or equal to 90% of their needs  MONITOR:   PO intake, Supplement acceptance, Labs, Weight trends, I & O's  REASON FOR ASSESSMENT:   Consult Assessment of nutrition requirement/status  ASSESSMENT:   78 y.o. male with medical history significant of dementia and Parkinson diabetes mellitus, HLD. Admitted for Acute metabolic encephalopathy .  Patient unable to provide history given dementia. Per chart review, pt's family reported pt had not been eating well PTA. Typically pt will eat breakfast and lunch.   Pt was evaluated by SLP 1/4: pt with moderate dysphagia and aspiration risk. Recommended dysphagia 2 diet with nectar thick liquids. Pt currently consuming 100% of meals provided. Will order a daily Vital Cuisine shake and pt is already receiving Magic Cups with dysphagia 2 diet.   Per weight records, pt has lost 6 lb since 02/08/18 (5% wt loss x 3.5 months, insignificant for time frame).   Medications: Thiamine tablet daily Labs reviewed: CBGs: 109-150 GFR: 13   NUTRITION - FOCUSED PHYSICAL EXAM:  Pt is mostly bedbound. Suspect LE depletions are mostly related to this.  Diet Order:   Diet Order            DIET DYS 2 Room service appropriate? Yes; Fluid consistency: Nectar Thick  Diet effective now              EDUCATION NEEDS:   Not appropriate for education at this time  Skin:  Skin Assessment: Reviewed RN Assessment  Last BM:  1/3  Height:   Ht Readings from Last 1 Encounters:  05/21/18 5\' 6"  (1.676 m)    Weight:   Wt Readings from Last 1 Encounters:  05/21/18 53.2 kg    Ideal Body  Weight:  64.5 kg  BMI:  Body mass index is 18.93 kg/m.  Estimated Nutritional Needs:   Kcal:  1400-1600  Protein:  65-75g  Fluid:  1.6L/day  Clayton Bibles, MS, RD, LDN [facility] Pager: [phone] After Hours Pager: [phone]

## 2018-05-22 NOTE — Progress Notes (Addendum)
[facility] PROGRESS NOTE  Stephen Frost JJO:[id number] DOB: [birth year] DOA: 05/20/2018 PCP: Stephen Brooks, MD   Narrative:  78 year old male At baseline bedbound frequent falls Parkinson's disease Dementia Diabetes mellitus without complication -Found on floor down 3 PM but might of been down prior to that Has been having significant back pain unclear how long he has been down  Emergency room work-up suggestive of UTI-x-rays of the back negative for injury, x-rays also of leg neck [-] Also found to have acute kidney injury with creatinine in the 4 range as baseline is usually below 1  A & Plan Acute metabolic encephalopathy multifactorial but most likely secondary to uremia versus polypharmacy-improved but still sleepy at this time seems to be nearing baseline  Acute kidney injury-PTA metformin, losartan discontinued continue IV fluids at 100 cc/h going forward-bladder scan-retained urine-keep indwelling Foley Discontinued Ditropan secondary to risk of retention from this FeNa surface of intrinsic disease ATN/AIN-if no resolution will not emergently speak to nephrology no emergent indications currently for dialysis and I have had long discussions on multiple days with wife daughter and do not think he is a good candidate for renal replacement therapy-at baseline he pretty much lays around at home and hardly ambulates If continues to have low bicarb start oral bicarb Because his ultrasound showed calcification above the bladder where the prostate was we got a PSA level is normal  Fall with low back pain-cut back opiates to Vicodin 1 every 4 as needed-would not reimage given good mobility no straight leg raising test and no hyperreflexia and if recurs will need imaging in 4 to 6 weeks  Parkinson's-continue carbidopa-has complications of dementia with this  Dilutional anemia-no evidences of dark stool and is on saline repletion-no further work-up at this time  Excoriation to  tip of penis-usually wears depends-continue Foley catheter for healing purposes on discharge  ?  UTI-patient initially on antibiotics discontinued 1/4 without sources of sepsis  Probable dysphagia-continue dysphagia 3 diet  Peripheral neuropathy-holding gabapentin as above  Parkinson's disease with dementia-on Sinemet 1 tab 3 times daily which we will continue  Reflux discontinue Prilosec  Hyperlipidemia holding statin at this time  Malnutrition BMI 18  Heparin, DNR, cussed with the wife at bedside-his premorbid baseline is poor he will verbalize but usually is listless does not do much and does not really ambulate uses depends I suspect we will probably need renal involvement and then discussion about goals and probably palliative care input   Stephen Bartles, MD  [facility] Direct contact: [phone] --Via amion app OR  --[email or web address]; password [email or web address]  7PM-7AM contact night coverage as above 05/22/2018, 1:29 PM  LOS: 0 days   Consultants:  None yet  Procedures:  None  Antimicrobials: No  Interval history/Subjective:  Awake alert no distress and had breakfast this morning Now somnolent and not really conversant but arousable Wife gives major history his only comment is "I need to party" Claims to have severe back pain-but is able to straight leg raise without deficit plantar dorsiflex and does not have sensory loss of   Objective:  Vitals:  Vitals:   05/21/18 2357 05/22/18 0405  BP: (!) 98/48 (!) 144/73  Pulse: 77 75  Resp: 18 18  Temp: 98.5 F (36.9 C) 98.7 F (37.1 C)  SpO2: 95% 93%    Exam:  Listless but arousable No distress Abdomen soft  Lower extremities not swollen he is slightly hyperreflexic in the knees but is able to bend his legs does not have pain  with straight leg raising sensory is intact he is able to sense cold touch he is able to dorsi plantarflex  I have personally reviewed the following:  DATA   Labs:  Bicarb down from  20-17, BUN/creatinine 67/4.4-->69/4.3  Hemoglobin 14.2-->11.0  PSA 2.0  A1c 4.9  TSH 1.7  Imaging studies:  Reviewed  Scheduled Meds: . brimonidine  1 drop Both Eyes TID  . carbidopa-levodopa  1 tablet Oral TID  . enoxaparin (LOVENOX) injection  30 mg Subcutaneous Q24H  . latanoprost  1 drop Both Eyes QHS  . oxybutynin  5 mg Oral Daily  . [START ON 05/23/2018] thiamine  100 mg Oral Daily  . timolol  1 drop Both Eyes BID   Continuous Infusions: . sodium chloride 75 mL/hr at 05/22/18 0600  . cefTRIAXone (ROCEPHIN)  IV Stopped (05/21/18 2254)    Active Problems:   Encephalopathy acute   DM type 2 (diabetes mellitus, type 2) ([place])   UTI (urinary tract infection)   Dementia due to Parkinson's disease with behavioral disturbance ([place])   AKI (acute kidney injury) ([place])   Dehydration   Malnutrition ([place])   LOS: 0 days

## 2018-05-22 NOTE — Progress Notes (Signed)
PHARMACIST - PHYSICIAN COMMUNICATION  DR:   Verlon Au  CONCERNING: IV to Oral Route Change Policy  RECOMMENDATION: This patient is receiving thiamine by the intravenous route.  Based on criteria approved by the Pharmacy and Therapeutics Committee, the intravenous medication(s) is/are being converted to the equivalent oral dose form(s).   DESCRIPTION: These criteria include:  The patient is eating (either orally or via tube) and/or has been taking other orally administered medications for a least 24 hours  The patient has no evidence of active gastrointestinal bleeding or impaired GI absorption (gastrectomy, short bowel, patient on TNA or NPO).  If you have questions about this conversion, please contact the Pharmacy Department  []   [phone] )  [facility] []   [phone] )  [facility] []   [phone] )  [facility] []   [phone] )  [facility] [x]   [phone] )  [place], PharmD, BCPS 05/22/2018 11:37 AM

## 2018-05-23 ENCOUNTER — Inpatient Hospital Stay (HOSPITAL_COMMUNITY): Payer: Medicare HMO

## 2018-05-23 LAB — CBC WITH DIFFERENTIAL/PLATELET
Abs Immature Granulocytes: 0.04 10*3/uL (ref 0.00–0.07)
Basophils Absolute: 0 10*3/uL (ref 0.0–0.1)
Basophils Relative: 0 %
Eosinophils Absolute: 0.4 10*3/uL (ref 0.0–0.5)
Eosinophils Relative: 6 %
HCT: 35.1 % — ABNORMAL LOW (ref 39.0–52.0)
Hemoglobin: 11.1 g/dL — ABNORMAL LOW (ref 13.0–17.0)
Immature Granulocytes: 1 %
Lymphocytes Relative: 18 %
Lymphs Abs: 1.4 10*3/uL (ref 0.7–4.0)
MCH: 29.2 pg (ref 26.0–34.0)
MCHC: 31.6 g/dL (ref 30.0–36.0)
MCV: 92.4 fL (ref 80.0–100.0)
Monocytes Absolute: 0.6 10*3/uL (ref 0.1–1.0)
Monocytes Relative: 8 %
Neutro Abs: 5.2 10*3/uL (ref 1.7–7.7)
Neutrophils Relative %: 67 %
Platelets: 177 10*3/uL (ref 150–400)
RBC: 3.8 MIL/uL — AB (ref 4.22–5.81)
RDW: 14.5 % (ref 11.5–15.5)
WBC: 7.7 10*3/uL (ref 4.0–10.5)
nRBC: 0 % (ref 0.0–0.2)

## 2018-05-23 LAB — RENAL FUNCTION PANEL
Albumin: 2.9 g/dL — ABNORMAL LOW (ref 3.5–5.0)
Anion gap: 9 (ref 5–15)
BUN: 63 mg/dL — ABNORMAL HIGH (ref 8–23)
CALCIUM: 7.8 mg/dL — AB (ref 8.9–10.3)
CO2: 17 mmol/L — ABNORMAL LOW (ref 22–32)
Chloride: 115 mmol/L — ABNORMAL HIGH (ref 98–111)
Creatinine, Ser: 4.04 mg/dL — ABNORMAL HIGH (ref 0.61–1.24)
GFR calc Af Amer: 16 mL/min — ABNORMAL LOW (ref >60)
GFR, EST NON AFRICAN AMERICAN: 14 mL/min — AB (ref >60)
Glucose, Bld: 117 mg/dL — ABNORMAL HIGH (ref 70–99)
Phosphorus: 3.8 mg/dL (ref 2.5–4.6)
Potassium: 4.6 mmol/L (ref 3.5–5.1)
Sodium: 141 mmol/L (ref 135–145)

## 2018-05-23 LAB — URINE CULTURE: Culture: NO GROWTH

## 2018-05-23 LAB — GLUCOSE, CAPILLARY
GLUCOSE-CAPILLARY: 141 mg/dL — AB (ref 70–99)
GLUCOSE-CAPILLARY: 153 mg/dL — AB (ref 70–99)
Glucose-Capillary: 110 mg/dL — ABNORMAL HIGH (ref 70–99)
Glucose-Capillary: 113 mg/dL — ABNORMAL HIGH (ref 70–99)
Glucose-Capillary: 137 mg/dL — ABNORMAL HIGH (ref 70–99)
Glucose-Capillary: 91 mg/dL (ref 70–99)

## 2018-05-23 MED ORDER — CLINDAMYCIN PHOSPHATE 300 MG/50ML IV SOLN
300.0000 mg | Freq: Four times a day (QID) | INTRAVENOUS | Status: DC
  Administered 2018-05-23 – 2018-05-24 (×3): 300 mg via INTRAVENOUS
  Filled 2018-05-23 (×4): qty 50

## 2018-05-23 MED ORDER — STERILE WATER FOR INJECTION IV SOLN
INTRAVENOUS | Status: DC
  Administered 2018-05-23 – 2018-05-24 (×2): via INTRAVENOUS
  Filled 2018-05-23 (×2): qty 850

## 2018-05-23 MED ORDER — SODIUM BICARBONATE 650 MG PO TABS
650.0000 mg | ORAL_TABLET | Freq: Two times a day (BID) | ORAL | Status: DC
  Administered 2018-05-23 – 2018-05-24 (×3): 650 mg via ORAL
  Filled 2018-05-23 (×3): qty 1

## 2018-05-23 MED ORDER — STARCH (THICKENING) PO POWD
ORAL | Status: DC | PRN
  Filled 2018-05-23 (×3): qty 227

## 2018-05-23 NOTE — Progress Notes (Signed)
Provider f/u call. Chest xray results read. Orders received to administer clindamycin 300 mg every 6 hours and repeat chest xray 2 view in am.

## 2018-05-23 NOTE — Progress Notes (Signed)
Physical Therapy Treatment Patient Details Name: Stephen Frost MRN: [id number] DOB: Oct 22, 1940 Today's Date: 05/23/2018    History of Present Illness Pt admitted s/p falls with back pain, UTI and dehydration.  Pt with hx of DM, Parkinsons and dementia    PT Comments    Progressing slowly with mobility. Pt requires increased time and multimodal cueing for participation. He also requires encouragement to progress activity. Continue to recommend SNF.    Follow Up Recommendations  SNF     Equipment Recommendations  None recommended by PT    Recommendations for Other Services       Precautions / Restrictions Precautions Precautions: Fall Restrictions Weight Bearing Restrictions: No    Mobility  Bed Mobility Overal bed mobility: Needs Assistance Bed Mobility: Supine to Sit     Supine to sit: Min guard;HOB elevated     General bed mobility comments: Repeated multimodal cueing required. Increased time.   Transfers Overall transfer level: Needs assistance Equipment used: Rolling walker (2 wheeled) Transfers: Sit to/from Stand Sit to Stand: Min assist;From elevated surface         General transfer comment: Repeated multimodal cueing required. Increased time to rise but pt is impulsive and attempts to stand despite cues.   Ambulation/Gait Ambulation/Gait assistance: Min assist;+2 safety/equipment Gait Distance (Feet): 40 Feet(40'x1, 10x1) Assistive device: Rolling walker (2 wheeled) Gait Pattern/deviations: Trunk flexed;Step-to pattern     General Gait Details: Pt using step to pattern on today. Multimodal cues required for safety, proper distance from RW, and to increase ambulation distance. Assist to stabilize pt and maneuver with RW.    Stairs             Wheelchair Mobility    Modified Rankin (Stroke Patients Only)       Balance           Standing balance support: Bilateral upper extremity supported Standing balance-Leahy Scale: Poor                               Cognition Arousal/Alertness: Awake/alert Behavior During Therapy: WFL for tasks assessed/performed Overall Cognitive Status: History of cognitive impairments - at baseline Area of Impairment: Safety/judgement;Orientation;Problem solving;Following commands                 Orientation Level: Disoriented to;Place;Time;Situation     Following Commands: Follows one step commands inconsistently Safety/Judgement: Decreased awareness of safety;Decreased awareness of deficits   Problem Solving: Requires verbal cues;Requires tactile cues        Exercises      General Comments        Pertinent Vitals/Pain Pain Assessment: Faces Faces Pain Scale: No hurt    Home Living                      Prior Function            PT Goals (current goals can now be found in the care plan section) Progress towards PT goals: Progressing toward goals    Frequency    Min 3X/week      PT Plan Current plan remains appropriate(3x/week for insurance notes)    Co-evaluation              AM-PAC PT "6 Clicks" Mobility   Outcome Measure  Help needed turning from your back to your side while in a flat bed without using bedrails?: A Little Help needed moving from lying on your back to  sitting on the side of a flat bed without using bedrails?: A Little Help needed moving to and from a bed to a chair (including a wheelchair)?: A Little Help needed standing up from a chair using your arms (e.g., wheelchair or bedside chair)?: A Little Help needed to walk in hospital room?: A Lot Help needed climbing 3-5 steps with a railing? : Total 6 Click Score: 15    End of Session Equipment Utilized During Treatment: Gait belt Activity Tolerance: Patient tolerated treatment well Patient left: in chair;with chair alarm set;with family/visitor present;with call bell/phone within reach   PT Visit Diagnosis: Difficulty in walking, not elsewhere classified  (R26.2);Muscle weakness (generalized) (M62.81)     Time: 1010-1031 PT Time Calculation (min) (ACUTE ONLY): 21 min  Charges:  $Gait Training: 8-22 mins                        Weston Anna, PT Acute Rehabilitation Services Pager: [phone] Office: [phone]

## 2018-05-23 NOTE — Evaluation (Signed)
Occupational Therapy Evaluation Patient Details Name: Stephen Frost MRN: [id number] DOB: March 23, 1941 Today's Date: 05/23/2018    History of Present Illness Pt admitted s/p falls with back pain, UTI and dehydration.  Pt with hx of DM, Parkinsons and dementia   Clinical Impression   Pt admitted with UTI. Pt currently with functional limitations due to the deficits listed below (see OT Problem List).  Pt will benefit from skilled OT to increase their safety and independence with ADL and functional mobility for ADL to facilitate discharge to venue listed below.      Follow Up Recommendations  Home health OT;SNF;Supervision/Assistance - 24 hour    Equipment Recommendations  None recommended by OT    Recommendations for Other Services       Precautions / Restrictions Precautions Precautions: Fall Restrictions Weight Bearing Restrictions: No      Mobility Bed Mobility   Bed Mobility: Supine to Sit           General bed mobility comments: pt in chair  Transfers Overall transfer level: Needs assistance Equipment used: Rolling walker (2 wheeled) Transfers: Sit to/from Stand Sit to Stand: Min assist;From elevated surface              Balance Overall balance assessment: Needs assistance Sitting-balance support: No upper extremity supported;Feet supported Sitting balance-Leahy Scale: Good     Standing balance support: Bilateral upper extremity supported Standing balance-Leahy Scale: Poor                             ADL either performed or assessed with clinical judgement   ADL Overall ADL's : Needs assistance/impaired Eating/Feeding: Minimal assistance;Sitting   Grooming: Minimal assistance;Sitting   Upper Body Bathing: Minimal assistance;Sitting   Lower Body Bathing: Maximal assistance;Sit to/from stand;Cueing for sequencing;Cueing for safety   Upper Body Dressing : Minimal assistance;Sitting   Lower Body Dressing: Maximal assistance;Sit  to/from stand;Cueing for sequencing;Cueing for safety       Toileting- Clothing Manipulation and Hygiene: Maximal assistance;Sit to/from stand;Cueing for sequencing;Cueing for safety         General ADL Comments: pt only has care during the day for a few hours as wife works. Will need increased care at home or SNF                  Pertinent Vitals/Pain Pain Assessment: No/denies pain           Communication Communication Communication: Other (comment)(INtermittently responds to questions/cues)   Cognition Arousal/Alertness: Awake/alert Behavior During Therapy: WFL for tasks assessed/performed Overall Cognitive Status: History of cognitive impairments - at baseline Area of Impairment: Safety/judgement;Orientation;Problem solving;Following commands                 Orientation Level: Disoriented to;Place;Time;Situation     Following Commands: Follows one step commands inconsistently Safety/Judgement: Decreased awareness of safety;Decreased awareness of deficits   Problem Solving: Requires verbal cues;Requires tactile cues                Home Living Family/patient expects to be discharged to:: Private residence Living Arrangements: Other (Comment)(Ex wife) Available Help at Discharge: Other (Comment)(HHA one hr a day while ex wife is at work) Type of Home: House Home Access: Level entry     [place]: One level     Bathroom Shower/Tub: Teacher, early years/pre: [place]: Environmental consultant - 2 wheels  Prior Functioning/Environment Level of Independence: Needs assistance  Gait / Transfers Assistance Needed: Ex wife reports pt uses RW for limited ambulation   Communication / Swallowing Assistance Needed: Pt only occasionally responds to questions, cues          OT Problem List: Decreased strength;Decreased activity tolerance;Impaired balance (sitting and/or standing);Decreased safety awareness;Decreased knowledge  of use of DME or AE      OT Treatment/Interventions: Self-care/ADL training;Patient/family education;Therapeutic exercise;Therapeutic activities;DME and/or AE instruction    OT Goals(Current goals can be found in the care plan section) Acute Rehab OT Goals Patient Stated Goal: home OT Goal Formulation: With patient/family Time For Goal Achievement: 06/06/18  OT Frequency: Min 2X/week   Barriers to D/C: Decreased caregiver support          Co-evaluation              AM-PAC OT "6 Clicks" Daily Activity     Outcome Measure Help from another person eating meals?: A Little Help from another person taking care of personal grooming?: A Little Help from another person toileting, which includes using toliet, bedpan, or urinal?: A Lot Help from another person bathing (including washing, rinsing, drying)?: A Lot Help from another person to put on and taking off regular upper body clothing?: A Little Help from another person to put on and taking off regular lower body clothing?: A Lot 6 Click Score: 15   End of Session Equipment Utilized During Treatment: Rolling walker Nurse Communication: Mobility status  Activity Tolerance: Patient tolerated treatment well Patient left: in chair;with chair alarm set;with family/visitor present  OT Visit Diagnosis: Unsteadiness on feet (R26.81);History of falling (Z91.81);Muscle weakness (generalized) (M62.81)                Time: 1754-1810 OT Time Calculation (min): 16 min Charges:  OT General Charges $OT Visit: 1 Visit OT Evaluation $OT Eval Moderate Complexity: 1 Mod  Kari Baars, [place] [phone] Office- [phone], Edwena Felty D 05/23/2018, 7:21 PM

## 2018-05-23 NOTE — Plan of Care (Signed)
Pt's PO intake is low. Pt had a BM this shift.

## 2018-05-23 NOTE — NC FL2 (Signed)
[place] LEVEL OF CARE SCREENING TOOL     IDENTIFICATION  Patient Name: Stephen Frost Birthdate: 1941/01/11 Sex: male Admission Date (Current Location): 05/20/2018  [facility] and [place] Number:  Herbalist and Address:  [facility],  [place] [address], [place]      Provider Number: [id number]  Attending Physician Name and Address:  Nita Sells, MD  Relative Name and Phone Number:  Odysseus Cada, [phone]    Current Level of Care: Hospital Recommended Level of Care: [place] Prior Approval Number:    Date Approved/Denied:   PASRR Number: [id number]  Discharge Plan: SNF    Current Diagnoses: Patient Active Problem List   Diagnosis Date Noted  . UTI (urinary tract infection) 05/20/2018  . Dementia due to Parkinson's disease with behavioral disturbance ([place]) 05/20/2018  . AKI (acute kidney injury) ([place]) 05/20/2018  . Dehydration 05/20/2018  . Malnutrition ([place]) 05/20/2018  . Hyperammonemia ([place]) 04/25/2014  . Encephalopathy acute 04/24/2014  . Hypoglycemia 04/24/2014  .  probable Aspiration pneumonia with sepsis 04/24/2014  . Hypothermia 04/24/2014  . DM type 2 (diabetes mellitus, type 2) ([place]) 04/24/2014    Orientation RESPIRATION BLADDER Height & Weight     Self  Normal Continent Weight: 117 lb 4.6 oz (53.2 kg) Height:  5\' 6"  (167.6 cm)  BEHAVIORAL SYMPTOMS/MOOD NEUROLOGICAL BOWEL NUTRITION STATUS      Continent Diet(dsy 2)  AMBULATORY STATUS COMMUNICATION OF NEEDS Skin   Limited Assist Verbally Normal                       Personal Care Assistance Level of Assistance  Bathing, Feeding, Dressing Bathing Assistance: Limited assistance Feeding assistance: Independent Dressing Assistance: Limited assistance     Functional Limitations Info  Sight, Hearing, Speech Sight Info: Adequate Hearing Info: Adequate Speech Info: Adequate    SPECIAL CARE FACTORS FREQUENCY  PT (By  licensed PT), OT (By licensed OT)     PT Frequency: 5x wk OT Frequency: 5x wk            Contractures Contractures Info: Not present    Additional Factors Info  Code Status, Allergies Code Status Info: DNR Allergies Info: INSULIN LISPRO, METFORMIN AND RELATED           Current Medications (05/23/2018):  This is the current hospital active medication list Current Facility-Administered Medications  Medication Dose Route Frequency Provider Last Rate Last Dose  . acetaminophen (TYLENOL) tablet 650 mg  650 mg Oral Q6H PRN Toy Baker, MD   650 mg at 05/23/18 0600   Or  . acetaminophen (TYLENOL) suppository 650 mg  650 mg Rectal Q6H PRN Doutova, Anastassia, MD      . brimonidine (ALPHAGAN) 0.15 % ophthalmic solution 1 drop  1 drop Both Eyes TID Doutova, Anastassia, MD   1 drop at 05/23/18 1100  . carbidopa-levodopa (SINEMET IR) 10-100 MG per tablet immediate release 1 tablet  1 tablet Oral TID Toy Baker, MD   1 tablet at 05/23/18 1100  . enoxaparin (LOVENOX) injection 30 mg  30 mg Subcutaneous Q24H Doutova, Anastassia, MD   30 mg at 05/23/18 1100  . food thickener (THICK IT) powder   Oral PRN Nita Sells, MD      . hydrALAZINE (APRESOLINE) injection 10 mg  10 mg Intravenous Q4H PRN Toy Baker, MD   10 mg at 05/23/18 0453  . HYDROcodone-acetaminophen (NORCO/VICODIN) 5-325 MG per tablet 1 tablet  1 tablet Oral Q4H  PRN Nita Sells, MD      . latanoprost (XALATAN) 0.005 % ophthalmic solution 1 drop  1 drop Both Eyes QHS Doutova, Anastassia, MD   1 drop at 05/22/18 2047  . levalbuterol (XOPENEX) nebulizer solution 0.63 mg  0.63 mg Nebulization Q6H PRN Doutova, Anastassia, MD      . ondansetron (ZOFRAN) tablet 4 mg  4 mg Oral Q6H PRN Doutova, Anastassia, MD       Or  . ondansetron (ZOFRAN) injection 4 mg  4 mg Intravenous Q6H PRN Doutova, Anastassia, MD      . sodium bicarbonate 150 mEq in sterile water 1,000 mL infusion   Intravenous Continuous  Roney Jaffe, MD      . sodium bicarbonate tablet 650 mg  650 mg Oral BID Nita Sells, MD   650 mg at 05/23/18 1100  . thiamine (VITAMIN B-1) tablet 100 mg  100 mg Oral Daily Nita Sells, MD   100 mg at 05/23/18 1100  . timolol (TIMOPTIC) 0.5 % ophthalmic solution 1 drop  1 drop Both Eyes BID Toy Baker, MD   1 drop at 05/23/18 1100     Discharge Medications: Please see discharge summary for a list of discharge medications.  Relevant Imaging Results:  Relevant Lab Results:   Additional Information SS# [id number]  Wende Neighbors, LCSW

## 2018-05-23 NOTE — Progress Notes (Signed)
Attempted to complete admission history. Pt spouse unable to assist.

## 2018-05-23 NOTE — Progress Notes (Signed)
[facility]ESS NOTE  Stephen Frost JQZ:[id number] DOB: October 18, 1940 DOA: 05/20/2018 PCP: Donita Brooks, MD   Narrative:  78 year old male At baseline bedbound frequent falls Parkinson's disease Severe presbycusis Dementia Diabetes mellitus without complication -Found on floor down 3 PM but might of been down prior to that Has been having significant back pain unclear how long he has been down  Emergency room work-up suggestive of UTI-x-rays of the back negative for injury, x-rays also of leg neck [-] Also found to have acute kidney injury with creatinine in the 4 range as baseline is usually below 1  A & Plan Acute metabolic encephalopathy multifactorial 2/2 uremia versus polypharmacy with opiates improved interactive and talkative this morning Suspect presbycusis may have had something to do with his lack of communication?  Acute kidney injury- PTA metformin, losartan discontinued continue IV fluids at 100 cc/h going forward-bladder scan-retained urine-keep indwelling Foley Discontinued Ditropan secondary to risk of retention from this FeNa suggestive intrinsic disease ATN/AIN-nephrology will see later today Kidney function is slightly improved-hopeful for recovery--- would defer biopsy and Starting bicarb 650 twice daily PSA normal   Probable urinary retention  Discontinued Ditropan as above keep   indwelling Foley-outpatient may need to see  urologist  Fall with low back pain -cut back opiates to Vicodin 1 every 4 on 1/5 Would use high-dose Tylenol on discharge  Parkinson's -continue carbidopa-has complications of dementia with this  Dilutional anemia-no evidences of dark stool and is on saline repletion-no further work-up at this time  Excoriation to tip of penis -usually wears depends-continue Foley catheter for healing purposes on discharge  ?  UTI -patient initially on antibiotics discontinued 1/4 without sources of sepsis  Probable  dysphagia -continue dysphagia 3 diet  Peripheral neuropathy -holding gabapentin as above  Parkinson's disease with dementia -on Sinemet 1 tab 3 times daily which we will continue  Reflux discontinue Prilosec  Hyperlipidemia holding statin at this time  Malnutrition BMI 18  Heparin, DNR, discussed previously with daughter and today with her again after meeting with wife on 1/5-patient will be seen by nephrology to determine further planning I suspect may need to have either goals of care discussions or discussions about eventual need for dialysis if kidney function does not improve over several days  Verlon Au, MD  [facility] Direct contact: [phone] --Via amion app OR  --[email or web address]; password [email or web address]  7PM-7AM contact night coverage as above 05/23/2018, 10:58 AM  LOS: 1 day   Consultants:  Nephrology  Procedures:  None  Antimicrobials: No  Interval history/Subjective:  Today's most coherent he has been he is following commands he does not complain of back pain he interacts although I have to talk loudly no fever no chills Passing urine "I feel great   Objective:  Vitals:  Vitals:   05/23/18 0446 05/23/18 0520  BP: (!) 182/87 (!) 146/76  Pulse: 72 81  Resp: 18 18  Temp: 97.8 F (36.6 C)   SpO2: 98%     Exam:  Awake pleasant no distress EOMI NCAT Chest clear S1-S2 no murmur Abdomen soft no rebound No lower extremity edema Neurologically following commands  I have personally reviewed the following:  DATA   Labs:  Bicarb 17  BUN/creatinine down from 69/4 0.3-60 3/4.0 chloride 115  Hemoglobin 14.2-->11.0 and maintaining in this range  PSA 2.0  A1c 4.9  TSH 1.7  Imaging studies:  Reviewed  Scheduled Meds: . brimonidine  1 drop Both Eyes TID  . carbidopa-levodopa  1 tablet Oral  TID  . enoxaparin (LOVENOX) injection  30 mg Subcutaneous Q24H  . latanoprost  1 drop Both Eyes QHS  . sodium bicarbonate  650 mg Oral BID  . thiamine   100 mg Oral Daily  . timolol  1 drop Both Eyes BID   Continuous Infusions: . sodium chloride 75 mL/hr at 05/23/18 0720    Active Problems:   Encephalopathy acute   DM type 2 (diabetes mellitus, type 2) ([place])   UTI (urinary tract infection)   Dementia due to Parkinson's disease with behavioral disturbance ([place])   AKI (acute kidney injury) ([place])   Dehydration   Malnutrition ([place])   LOS: 1 day

## 2018-05-23 NOTE — Progress Notes (Signed)
RN entered patient room. Pt was found in bed vomiting while laying flat. Pt's spouse was at bedside and reports pt had been vomiting while laying on back for about 5 minutes prior to RN entry into the room. Pt was sat up on the side of the bed to assist. Pt has a strong cough.  Pt vomited a large amount unable to measure. VS assessed, see flow sheet. Oxygen applied at 2 lt. Provider contacted to report findings, orders received, stat portable chest xray & care order to call provider should Temp be greater than 100.0.

## 2018-05-23 NOTE — Consult Note (Signed)
Renal Service Consult Note [facility]  Stephen Frost 05/23/2018 Stephen Frost Requesting Physician:  Dr Verlon Au  Reason for Consult:  AKI, parkinson's w/ dementia HPI: The patient is a 78 y.o. year-old with hx of parkinson's w/ sig dementia, and DM2 was found on floor at home w/ new back pain.  In ED w/u suggested UTI and AKI , creat 4.  Pt was admitted and started on abx and IVF's.  Creat bumped to 4.3 and is down to 4.0 this am.  Pt not giving much hx.  Wife at bedside.   Only old creat was 0.97 in 2015 here.   Renal US showed 10- 12 cm kidneys w/ no hydro and mild ^'d echogenicity.    UA showed >300 protein and >50 rbc/ 20-50 wbcs.  Rare bacteria  UNa 48  Ucreat 72.      ROS  denies CP  no joint pain   no HA  no blurry vision  no rash  no diarrhea  no nausea/ vomiting   Past Medical History  Past Medical History:  Diagnosis Date  . Acid reflux   . Arthritis    in spine  . Cataract 2012   bilateral eye   . Dementia ([place]) 05/20/2018   Per EMS and caregiver  . Diabetes mellitus without complication ([place])    type 2  . Elevated cholesterol   . Melanoma ([place])    foot  . Pneumonia    Past Surgical History  Past Surgical History:  Procedure Laterality Date  . CATARACT EXTRACTION, BILATERAL    . EYE SURGERY    . GLAUCOMA SURGERY    . HERNIA REPAIR     double  . TONSILLECTOMY     Family History  Family History  Problem Relation Age of Onset  . Alcoholism Mother   . Heart attack Father   . Colon cancer Neg Hx   . Esophageal cancer Neg Hx   . Stomach cancer Neg Hx   . Rectal cancer Neg Hx    Social History  reports that he has never smoked. He has never used smokeless tobacco. He reports that he does not drink alcohol or use drugs. Allergies  Allergies  Allergen Reactions  . Insulin Lispro Other (See Comments)  . Metformin And Related Diarrhea   Home medications Prior to Admission medications   Medication Sig Start Date End Date  Taking? Authorizing Provider  atorvastatin (LIPITOR) 40 MG tablet Take 40 mg by mouth daily.   Yes [provider]  Brinzolamide-Brimonidine (SIMBRINZA) 1-0.2 % SUSP Apply 1 drop to eye 3 (three) times daily.   Yes [provider]  carbidopa-levodopa (SINEMET IR) 10-100 MG tablet Take 1 tablet by mouth 3 (three) times daily.   Yes [provider]  gabapentin (NEURONTIN) 800 MG tablet Take 800 mg by mouth 3 (three) times daily.   Yes [provider]  glucose 4 GM chewable tablet Chew 1 tablet by mouth as needed for low blood sugar.   Yes [provider]  insulin glargine (LANTUS) 100 UNIT/ML injection Inject 0.3 mLs (30 Units total) into the skin daily. Patient taking differently: Inject 27 Units into the skin every evening.  04/30/14  Yes Vann, Jessica U, DO  latanoprost (XALATAN) 0.005 % ophthalmic solution 1 drop at bedtime.   Yes [provider]  losartan (COZAAR) 50 MG tablet Take 25 mg by mouth daily.   Yes [provider]  metformin (FORTAMET) 500 MG (OSM) 24 hr  tablet Take 500 mg by mouth daily with breakfast.   Yes [provider]  omeprazole (PRILOSEC) 20 MG capsule Take 20 mg by mouth daily.   Yes [provider]  oxybutynin (DITROPAN) 5 MG tablet Take 5 mg by mouth daily.   Yes [provider]  SEMAGLUTIDE, 1 MG/DOSE, [place] Inject 1 Units into the skin once a week. Monday   Yes [provider]  timolol (TIMOPTIC) 0.5 % ophthalmic solution 1 drop 2 (two) times daily.   Yes [provider]   Liver Function Tests Recent Labs  Lab 05/20/18 1618 05/21/18 0243 05/23/18 0528  AST 23 22  --   ALT 25 16  --   ALKPHOS 135* 122  --   BILITOT 1.1 0.8  --   PROT 8.4* 7.6  --   ALBUMIN 4.4 4.0 2.9*   No results for input(s): LIPASE, AMYLASE in the last 168 hours. CBC Recent Labs  Lab 05/20/18 1618 05/21/18 0243 05/22/18 0736 05/23/18 0528  WBC 12.6* 11.6* 8.1 7.7  NEUTROABS  10.9*  --  5.6 5.2  HGB 15.4 14.2 11.0* 11.1*  HCT 47.0 44.4 34.7* 35.1*  MCV 90.9 91.0 92.8 92.4  PLT 232 201 180 601   Basic Metabolic Panel Recent Labs  Lab 05/20/18 1618 05/21/18 0243 05/22/18 0543 05/23/18 0528  NA 142 145 141 141  K 5.0 4.7 4.9 4.6  CL 104 112* 115* 115*  CO2 21* 20* 17* 17*  GLUCOSE 133* 105* 119* 117*  BUN 68* 67* 69* 63*  CREATININE 4.75* 4.42* 4.30* 4.04*  CALCIUM 9.4 8.7* 7.9* 7.8*  PHOS  --  5.2*  --  3.8   Iron/TIBC/Ferritin/ %Sat No results found for: IRON, TIBC, FERRITIN, IRONPCTSAT  Vitals:   05/23/18 0003 05/23/18 0446 05/23/18 0520 05/23/18 1511  BP: (!) 167/87 (!) 182/87 (!) 146/76 (!) 158/83  Pulse: 64 72 81 74  Resp: 18 18 18 18   Temp: 98.2 F (36.8 C) 97.8 F (36.6 C)  98.4 F (36.9 C)  TempSrc: Oral Oral  Oral  SpO2: 96% 98%  96%  Weight:      Height:       Exam Gen pt is somnolent but awakens easily, confused, knows 'hospital" but not much more, no idea why he is here. Wife in room No rash, cyanosis or gangrene Sclera anicteric, throat clear and slightly moist  No jvd or bruits, flat neck veins Chest clear bilat to bases RRR no MRG Abd soft ntnd no mass or ascites +bs GU normal male w indwelling foley draining clear amber urine MS no joint effusions or deformity Ext no LE or UE edema, no wounds or ulcers Neuro is alert, Ox1 , some resting tremors    Home meds:  - atorvastatin 40 qd  - carbidopa-levodopa 10-100 tid/ gabapentin 800 tid  - metformin 500 am/ insulin glargine 27u hs/ semaglutide 1u weekly sq  - losartan 25 qd  - omeprazole 20 qd  - eyedrops/ oxybutynin 5 qd   A: 1. Renal failure - not sure acute vs chronic.  Renal fxn was normal in 2015.  Here he has some ^'d echo on [place] but [place] otherwise normal.  UA shows proteinuria >300 but his is sometime seen w/ high cell counts.  Agree w/ IVF's likely dehydrated on admission w/ ^'d Hb 14.  Hold ARB.  Avoid nsaids/ contrast.  Keep BP's up w/ poss AKI.  Will follow.   2. Dementia 3. Parkinson's 4. DM2 on insulin 5.  HTN on losartan  P:  1. May use CCB or hydralazine for HTN if needed 2. Check quant proteinuria, C3/ C4 3. Will follow     Kelly Splinter MD [facility] pager [phone]   05/23/2018, 4:04 PM

## 2018-05-24 ENCOUNTER — Inpatient Hospital Stay (HOSPITAL_COMMUNITY): Payer: Medicare HMO

## 2018-05-24 LAB — GLUCOSE, CAPILLARY
Glucose-Capillary: 113 mg/dL — ABNORMAL HIGH (ref 70–99)
Glucose-Capillary: 114 mg/dL — ABNORMAL HIGH (ref 70–99)
Glucose-Capillary: 119 mg/dL — ABNORMAL HIGH (ref 70–99)
Glucose-Capillary: 129 mg/dL — ABNORMAL HIGH (ref 70–99)
Glucose-Capillary: 136 mg/dL — ABNORMAL HIGH (ref 70–99)
Glucose-Capillary: 143 mg/dL — ABNORMAL HIGH (ref 70–99)

## 2018-05-24 LAB — RENAL FUNCTION PANEL
Albumin: 2.9 g/dL — ABNORMAL LOW (ref 3.5–5.0)
Anion gap: 9 (ref 5–15)
BUN: 54 mg/dL — ABNORMAL HIGH (ref 8–23)
CO2: 22 mmol/L (ref 22–32)
Calcium: 7.8 mg/dL — ABNORMAL LOW (ref 8.9–10.3)
Chloride: 110 mmol/L (ref 98–111)
Creatinine, Ser: 3.74 mg/dL — ABNORMAL HIGH (ref 0.61–1.24)
GFR calc Af Amer: 17 mL/min — ABNORMAL LOW (ref >60)
GFR calc non Af Amer: 15 mL/min — ABNORMAL LOW (ref >60)
Glucose, Bld: 120 mg/dL — ABNORMAL HIGH (ref 70–99)
Phosphorus: 3.6 mg/dL (ref 2.5–4.6)
Potassium: 3.7 mmol/L (ref 3.5–5.1)
Sodium: 141 mmol/L (ref 135–145)

## 2018-05-24 LAB — MPO/PR-3 (ANCA) ANTIBODIES: Myeloperoxidase Abs: <9 U/mL (ref 0.0–9.0)

## 2018-05-24 LAB — HEPATITIS PANEL, ACUTE
HCV Ab: <0.1 s/co ratio (ref 0.0–0.9)
Hep A IgM: NEGATIVE
Hep B C IgM: NEGATIVE
Hepatitis B Surface Ag: NEGATIVE

## 2018-05-24 LAB — PROTEIN ELECTROPHORESIS, SERUM
A/G Ratio: 1.2 (ref 0.7–1.7)
Albumin ELP: 2.8 g/dL — ABNORMAL LOW (ref 2.9–4.4)
Alpha-1-Globulin: 0.2 g/dL (ref 0.0–0.4)
Alpha-2-Globulin: 0.7 g/dL (ref 0.4–1.0)
Beta Globulin: 0.5 g/dL — ABNORMAL LOW (ref 0.7–1.3)
Gamma Globulin: 0.9 g/dL (ref 0.4–1.8)
Globulin, Total: 2.4 g/dL (ref 2.2–3.9)
Total Protein ELP: 5.2 g/dL — ABNORMAL LOW (ref 6.0–8.5)

## 2018-05-24 LAB — KAPPA/LAMBDA LIGHT CHAINS
Kappa free light chain: 107.1 mg/L — ABNORMAL HIGH (ref 3.3–19.4)
Kappa, lambda light chain ratio: 2.31 — ABNORMAL HIGH (ref 0.26–1.65)
LAMDA FREE LIGHT CHAINS: 46.4 mg/L — AB (ref 5.7–26.3)

## 2018-05-24 LAB — C3 COMPLEMENT: C3 Complement: 92 mg/dL (ref 82–167)

## 2018-05-24 LAB — C4 COMPLEMENT: Complement C4, Body Fluid: 16 mg/dL (ref 14–44)

## 2018-05-24 MED ORDER — SODIUM CHLORIDE 0.9 % IV SOLN
INTRAVENOUS | Status: DC
  Administered 2018-05-24 – 2018-05-26 (×3): via INTRAVENOUS

## 2018-05-24 NOTE — Progress Notes (Addendum)
[facility] PROGRESS NOTE  Stephen Frost ZTI:[id number] DOB: Jan 05, 1941 DOA: 05/20/2018 PCP: Donita Brooks, MD   Narrative:  78 year old male At baseline bedbound frequent falls Parkinson's disease Severe presbycusis Dementia Diabetes mellitus without complication -Found on floor down 3 PM but might of been down prior to that Has been having significant back pain unclear how long he has been down  Emergency room work-up suggestive of UTI-x-rays of the back negative for injury, x-rays also of leg neck [-] Also found to have acute kidney injury with creatinine in the 4 range as baseline is usually below 1  A & Plan Acute metabolic encephalopathy multifactorial 2/2 uremia versus polypharmacy with opiates improved interactive and talkative this morning Suspect presbycusis contributing  Acute kidney injury- Labs steadily improving-over past 2 days as well has put out about 4.1 L so feel he is at the nonoliguric phase of ATN PTA metformin, losartan Ditropan discontinued  Saline locked 1/7 FeNa suggestive intrinsic disease ATN/AIN-nephrology input appreciated, follow ANCA electrophoresis and other labs C3-C4 complement is negative PSA normal  D/c bicarb 650 twice daily  ?aspiration pneumonitis secondary vomiting 1/6 aspiration ---cardiomegaly and effusions on chest x-ray Is +4 L Keep fluids even today but if nephrology feels does need further saline is reasonable to continue Initially given clindamycin 1/6 but because of no further episodes no fever curve and no other symptoms or findings, suspect just pneumonitis and would not treat beyond this  Probable urinary retention   indwelling Foley-outpatient may need to see urologist  Fall with low back pain cut back opiates to Vicodin 1 every 4 on 1/5 Would use high-dose Tylenol on discharge  Parkinson's -continue carbidopa-has complications of dementia with this  Dilutional anemia-no evidences of dark stool and is on  saline repletion-no further work-up at this time  Excoriation to tip of penis usually wears depends-continue Foley catheter for healing purposes on discharge  ?  UTI patient initially on antibiotics discontinued 1/4 without sources of sepsis  Probable dysphagia continue dysphagia 3 diet  Peripheral neuropathy holding gabapentin as above  Parkinson's disease with dementia on Sinemet 1 tab 3 times daily which we will continue  Reflux discontinue Prilosec  Hyperlipidemia holding statin at this time  Malnutrition BMI 18  Heparin, DNR, spoke with wife on phone-updated 1/7 will be several days before can discharge  Verlon Au, MD  [facility] Direct contact: [phone] --Via amion app OR  --[email or web address]; password [email or web address]  7PM-7AM contact night coverage as above 05/24/2018, 1:02 PM  LOS: 2 days   Consultants:  Nephrology  Procedures:  None  Antimicrobials: No  Interval history/Subjective:  More awake coherent and not in any current distress eating drinking No further vomiting that was found yesterday on 1/6 in the evening and no fever or other complaints at this time not requiring oxygen Looks like he was placed on IV bicarb at 120 last night   Objective:  Vitals:  Vitals:   05/24/18 0812 05/24/18 1039  BP:  (!) 152/74  Pulse:  81  Resp:  16  Temp:  98.1 F (36.7 C)  SpO2: 91% 92%    Exam:  Awake pleasant laconic Chest clear without rales or rhonchi Abdomen is soft no rebound No lower extremity edema Neurologically intact  I have personally reviewed the following:  DATA   Labs:  Bicarb 22 BUN/creatinine down from 60/4.0-->54/3.7   PSA 2.0  A1c 4.9  TSH 1.7  Imaging studies:  Reviewed  Scheduled Meds: . brimonidine  1 drop Both Eyes  TID  . carbidopa-levodopa  1 tablet Oral TID  . enoxaparin (LOVENOX) injection  30 mg Subcutaneous Q24H  . latanoprost  1 drop Both Eyes QHS  . sodium bicarbonate  650 mg Oral BID  . thiamine  100  mg Oral Daily  . timolol  1 drop Both Eyes BID   Continuous Infusions: .  sodium bicarbonate (isotonic) infusion in sterile water 40 mL/hr at 05/24/18 0908    Active Problems:   Encephalopathy acute   DM type 2 (diabetes mellitus, type 2) ([place])   UTI (urinary tract infection)   Dementia due to Parkinson's disease with behavioral disturbance ([place])   AKI (acute kidney injury) ([place])   Dehydration   Malnutrition ([place])   LOS: 2 days

## 2018-05-24 NOTE — Progress Notes (Signed)
[facility] Progress Note  Subjective: 1.6 liters out yest, creat down 3.7 today  Vitals:   05/24/18 0033 05/24/18 0450 05/24/18 0812 05/24/18 1039  BP: (!) 171/83 (!) 176/86  (!) 152/74  Pulse: 72 79  81  Resp: 18 20  16   Temp: 98.1 F (36.7 C) 98.3 F (36.8 C)  98.1 F (36.7 C)  TempSrc: Oral Oral  Oral  SpO2: 96% 90% 91% 92%  Weight:      Height:        Inpatient medications: . brimonidine  1 drop Both Eyes TID  . carbidopa-levodopa  1 tablet Oral TID  . enoxaparin (LOVENOX) injection  30 mg Subcutaneous Q24H  . latanoprost  1 drop Both Eyes QHS  . thiamine  100 mg Oral Daily  . timolol  1 drop Both Eyes BID    acetaminophen **OR** acetaminophen, food thickener, hydrALAZINE, HYDROcodone-acetaminophen, levalbuterol, ondansetron **OR** ondansetron (ZOFRAN) IV  Iron/TIBC/Ferritin/ %Sat No results found for: IRON, TIBC, FERRITIN, IRONPCTSAT  Exam: Gen pt is still very confused and nonverbal, eyes closed, no distress No jvd or bruits, flat neck veins Chest clear bilat to bases RRR no MRG Abd soft ntnd no mass or ascites +bs GU normal male w indwelling foley draining clear amber urine Ext no LE or UE edema Neuro is alert, Ox1 , some resting tremors    Home meds:  - atorvastatin 40 qd  - carbidopa-levodopa 10-100 tid/ gabapentin 800 tid  - metformin 500 am/ insulin glargine 27u hs/ semaglutide 1u weekly sq  - losartan 25 qd  - omeprazole 20 qd  - eyedrops/ oxybutynin 5 qd   A/ P: 1. Renal failure - not sure acute vs chronic.  Renal fxn was normal in 2015.  Here he has some ^'d echo on [place] but otherwise is wnl.  Creat down w/ IVF's.  Cont IVF's w/ NS at 75 cc/hr. Poor dialysis candidate, have d/w pt's wife.  Will follow.  2. Dementia 3. Parkinson's 4. DM2 on insulin 5. HTN on losartan    Kelly Splinter MD [facility] pager [phone]   05/24/2018, 3:17 PM   Recent Labs  Lab 05/20/18 1618  05/23/18 0528 05/24/18 0535  NA  142   < > 141 141  K 5.0   < > 4.6 3.7  CL 104   < > 115* 110  CO2 21*   < > 17* 22  GLUCOSE 133*   < > 117* 120*  BUN 68*   < > 63* 54*  CREATININE 4.75*   < > 4.04* 3.74*  CALCIUM 9.4   < > 7.8* 7.8*  PHOS  --    < > 3.8 3.6  ALBUMIN 4.4   < > 2.9* 2.9*  INR 0.93  --   --   --    < > = values in this interval not displayed.   Recent Labs  Lab 05/20/18 1618 05/21/18 0243  AST 23 22  ALT 25 16  ALKPHOS 135* 122  BILITOT 1.1 0.8  PROT 8.4* 7.6   Recent Labs  Lab 05/22/18 0736 05/23/18 0528  WBC 8.1 7.7  NEUTROABS 5.6 5.2  HGB 11.0* 11.1*  HCT 34.7* 35.1*  MCV 92.8 92.4  PLT 180 177

## 2018-05-24 NOTE — Clinical Social Work Note (Signed)
Clinical Social Work Assessment  Patient Details  Name: Stephen Frost MRN: [id number] Date of Birth: [birth year]  Date of referral:  05/24/18               Reason for consult:  Facility Placement, Discharge Planning                Permission sought to share information with:  Family Supports Permission granted to share information::  Yes, Verbal Permission Granted  Name::     Jamari Moten   Agency::  snf  Relationship::  [phone]  Contact Information:  wife  Housing/Transportation Living arrangements for the past 2 months:  Single Family Home Source of Information:  Spouse Patient Interpreter Needed:  None Criminal Activity/Legal Involvement Pertinent to Current Situation/Hospitalization:  No - Comment as needed Significant Relationships:  Adult Children, Spouse Lives with:  Spouse Do you feel safe going back to the place where you live?  Yes Need for family participation in patient care:  Yes (Comment)  Care giving concerns:  Patient is only cognitive to self. CSW met patients wife at bedside while patient slept. Langley Gauss (pt wife) stated she is agreeable with patient to go to a rehab facility and would like his [place] social worker to be involve to assist with Clinical research associate / plan:  CSW met patient at bedside to discuss discharge plans. CSW gave Langley Gauss a list of facilities that have offered a SNF bed on behalf of patient. Langley Gauss stated she would like [facility] since it is close to her home and she would be able to check in on patient. CSW will follow up with facility to make sure authorization has been started through insurance   Employment status:  Retired Nurse, adult PT Recommendations:  [place] / Referral to community resources:  [place]  Patient/Family's Response to care:  Langley Gauss stated she is taking patients hospitalization day by day. Denise verbalized appreciation for the  care patient has received   Patient/Family's Understanding of and Emotional Response to Diagnosis, Current Treatment, and Prognosis:  Family agreeable with discharge plan to SNF  Emotional Assessment Appearance:  Appears stated age Attitude/Demeanor/Rapport:  Unable to Assess Affect (typically observed):  Unable to Assess Orientation:  Oriented to Self Alcohol / Substance use:  Not Applicable Psych involvement (Current and /or in the community):  No (Comment)  Discharge Needs  Concerns to be addressed:  Care Coordination Readmission within the last 30 days:  No Current discharge risk:  Dependent with Mobility Barriers to Discharge:  [place], LCSW 05/24/2018, 5:15 PM

## 2018-05-25 DIAGNOSIS — G9340 Encephalopathy, unspecified: Secondary | ICD-10-CM

## 2018-05-25 LAB — CBC WITH DIFFERENTIAL/PLATELET
ABS IMMATURE GRANULOCYTES: 0.06 10*3/uL (ref 0.00–0.07)
BASOS PCT: 0 %
Basophils Absolute: 0 10*3/uL (ref 0.0–0.1)
Eosinophils Absolute: 0.3 10*3/uL (ref 0.0–0.5)
Eosinophils Relative: 4 %
HCT: 35.9 % — ABNORMAL LOW (ref 39.0–52.0)
Hemoglobin: 11.5 g/dL — ABNORMAL LOW (ref 13.0–17.0)
Immature Granulocytes: 1 %
Lymphocytes Relative: 17 %
Lymphs Abs: 1.3 10*3/uL (ref 0.7–4.0)
MCH: 28.8 pg (ref 26.0–34.0)
MCHC: 32 g/dL (ref 30.0–36.0)
MCV: 90 fL (ref 80.0–100.0)
Monocytes Absolute: 0.7 10*3/uL (ref 0.1–1.0)
Monocytes Relative: 8 %
NEUTROS ABS: 5.7 10*3/uL (ref 1.7–7.7)
Neutrophils Relative %: 70 %
PLATELETS: 196 10*3/uL (ref 150–400)
RBC: 3.99 MIL/uL — ABNORMAL LOW (ref 4.22–5.81)
RDW: 14.4 % (ref 11.5–15.5)
WBC: 8.1 10*3/uL (ref 4.0–10.5)
nRBC: 0 % (ref 0.0–0.2)

## 2018-05-25 LAB — RENAL FUNCTION PANEL
Albumin: 3.1 g/dL — ABNORMAL LOW (ref 3.5–5.0)
Anion gap: 8 (ref 5–15)
BUN: 52 mg/dL — ABNORMAL HIGH (ref 8–23)
CO2: 21 mmol/L — ABNORMAL LOW (ref 22–32)
Calcium: 7.7 mg/dL — ABNORMAL LOW (ref 8.9–10.3)
Chloride: 110 mmol/L (ref 98–111)
Creatinine, Ser: 3.87 mg/dL — ABNORMAL HIGH (ref 0.61–1.24)
GFR calc Af Amer: 17 mL/min — ABNORMAL LOW (ref >60)
GFR calc non Af Amer: 14 mL/min — ABNORMAL LOW (ref >60)
Glucose, Bld: 129 mg/dL — ABNORMAL HIGH (ref 70–99)
Phosphorus: 4.3 mg/dL (ref 2.5–4.6)
Potassium: 3.7 mmol/L (ref 3.5–5.1)
Sodium: 139 mmol/L (ref 135–145)

## 2018-05-25 LAB — GLUCOSE, CAPILLARY
GLUCOSE-CAPILLARY: 100 mg/dL — AB (ref 70–99)
Glucose-Capillary: 101 mg/dL — ABNORMAL HIGH (ref 70–99)
Glucose-Capillary: 107 mg/dL — ABNORMAL HIGH (ref 70–99)
Glucose-Capillary: 107 mg/dL — ABNORMAL HIGH (ref 70–99)
Glucose-Capillary: 108 mg/dL — ABNORMAL HIGH (ref 70–99)
Glucose-Capillary: 125 mg/dL — ABNORMAL HIGH (ref 70–99)

## 2018-05-25 MED ORDER — HYDRALAZINE HCL 20 MG/ML IJ SOLN
10.0000 mg | Freq: Four times a day (QID) | INTRAMUSCULAR | Status: DC | PRN
  Administered 2018-05-25 – 2018-05-26 (×3): 10 mg via INTRAVENOUS
  Filled 2018-05-25 (×3): qty 1

## 2018-05-25 NOTE — Progress Notes (Signed)
[facility] PROGRESS NOTE  Stephen Frost GYJ:[id number] DOB: 1940-12-18 DOA: 05/20/2018 PCP: Donita Brooks, MD   Narrative:  78 year old male At baseline bedbound frequent falls Parkinson's disease Severe presbycusis Dementia Diabetes mellitus without complication -Found on floor down 3 PM but might of been down prior to that Has been having significant back pain unclear how long he has been down  Emergency room work-up suggestive of UTI-x-rays of the back negative for injury, x-rays also of leg neck [-] Also found to have acute kidney injury with creatinine in the 4 range as baseline is usually below 1 (creatinine was 1.1 on November 01, 2016 according to care everywhere records)  A & Plan Acute metabolic encephalopathy multifactorial 2/2 uremia versus polypharmacy with opiates--- admit lethargic, disoriented and minimally verbal, suspect uremia as a primary culprit  Acute kidney injury- on CKD III--- creatinine was 1.1 on November 01, 2016 according to care everywhere records, patient apparently developed CKD stage III in 2018, this admission creatinine was 4.7 on admission, slight improvement initially with hydration however creatinine remains around for this time--patient remains uremic PTA metformin, losartan Ditropan discontinued  No significant improvement in renal function, uremia symptoms persist, FeNa suggestive intrinsic disease ATN/AIN-nephrology input appreciated,  electrophoresis and other labs C3-C4 complement is negative PSA normal ---Brief w/u was negative for ANCA/ myeloma. Suspect pt is esrd at this point and uremic w/ somnolence.  Not a candidate for dialysis or immunosuppression given underlying comorbidities--- nephrologist palliative/hospice options with patient's wife official palliative care consult pending  ?aspiration pneumonitis secondary vomiting 1/6 aspiration ---cardiomegaly and effusions on chest x-ray Is +4 L Initially given clindamycin 1/6 but because  of no further episodes no fever curve and no other symptoms or findings, suspect just pneumonitis and would not treat beyond this  Probable urinary retention   indwelling Foley---  Fall with low back pain -Pain control appears adequate  Parkinson's -continue carbidopa-has complications of dementia with this  Dilutional anemia-no evidences of dark stool and is on saline repletion-no further work-up at this time  FEN--- patient is too lethargic to have any significant oral intake  Peripheral neuropathy holding gabapentin as above  Parkinson's disease with dementia on Sinemet 1 tab 3 times daily which we will continue  Hyperlipidemia holding statin at this time  Malnutrition BMI 18   Roxan Hockey, MD  [facility] --Via [employer] app OR  --[email or web address]; password [email or web address]  7PM-7AM contact night coverage as above 05/25/2018, 7:22 PM  LOS: 3 days   Consultants:  Nephrology/Palliative care consult pending  Procedures:  None  Antimicrobials: No  Interval history/Subjective:  Remains mostly nonverbal, lethargic, looks ill   Objective:  Vitals:  Vitals:   05/25/18 0551 05/25/18 1405  BP: (!) 184/92 (!) 186/94  Pulse: 81 76  Resp: 18 20  Temp: 98.7 F (37.1 C) 98.1 F (36.7 C)  SpO2: (!) 89% 96%    Exam:   Physical Exam  Patient is examined daily including today on 05/25/18 , exams remain the same as of yesterday except that has changed   Gen:-Sleepy  HEENT:- [place].AT, No sclera icterus Neck-Supple Neck,No JVD,.  Lungs-diminished in bases, no wheezing CV- S1, S2 normal Abd-  +ve B.Sounds, Abd Soft, No tenderness,    Extremity/Skin:-Warm and dry  psych-affect is flat, sleepy and disoriented  neuro-generalized weakness with no new focal deficits, at baseline patient has parkinsonian tremors  Scheduled Meds: . brimonidine  1 drop Both Eyes TID  . carbidopa-levodopa  1 tablet Oral TID  .  enoxaparin (LOVENOX) injection  30 mg Subcutaneous Q24H  .  latanoprost  1 drop Both Eyes QHS  . thiamine  100 mg Oral Daily  . timolol  1 drop Both Eyes BID   Continuous Infusions: . sodium chloride 50 mL/hr at 05/25/18 1331    Active Problems:   Encephalopathy acute   DM type 2 (diabetes mellitus, type 2) ([place])   UTI (urinary tract infection)   Dementia due to Parkinson's disease with behavioral disturbance ([place])   AKI (acute kidney injury) ([place])   Dehydration   Malnutrition ([place])   LOS: 3 days

## 2018-05-25 NOTE — Care Management Important Message (Signed)
Important Message  Patient Details  Name: KINNICK MAUS MRN: [id number] Date of Birth: 10/03/1940   Medicare Important Message Given:  Yes    Kerin Salen 05/25/2018, 11:36 AMImportant Message  Patient Details  Name: DIALLO PONDER MRN: [id number] Date of Birth: 25-Apr-1941   Medicare Important Message Given:  Yes    Kerin Salen 05/25/2018, 11:36 AM

## 2018-05-25 NOTE — Progress Notes (Signed)
[facility] Progress Note  Subjective: good UOP, creat up 3.8 today. Foley in place. Pt w/o complaints, very minimal verbal output  Vitals:   05/24/18 1659 05/24/18 2038 05/25/18 0211 05/25/18 0551  BP: 131/75 (!) 161/71 (!) 190/93 (!) 184/92  Pulse: 80 75 75 81  Resp: 18 18 16 18   Temp: 98 F (36.7 C) 98.6 F (37 C) 98.6 F (37 C) 98.7 F (37.1 C)  TempSrc: Oral Oral Oral Oral  SpO2: 94% 93% 92% (!) 89%  Weight:      Height:        Inpatient medications: . brimonidine  1 drop Both Eyes TID  . carbidopa-levodopa  1 tablet Oral TID  . enoxaparin (LOVENOX) injection  30 mg Subcutaneous Q24H  . latanoprost  1 drop Both Eyes QHS  . thiamine  100 mg Oral Daily  . timolol  1 drop Both Eyes BID   . sodium chloride 75 mL/hr at 05/25/18 1052   acetaminophen **OR** acetaminophen, food thickener, hydrALAZINE, HYDROcodone-acetaminophen, levalbuterol, ondansetron **OR** ondansetron (ZOFRAN) IV  Iron/TIBC/Ferritin/ %Sat No results found for: IRON, TIBC, FERRITIN, IRONPCTSAT  Exam: Gen pt is still wtihdrawn and barely verbal, no distress No jvd or bruits, flat neck veins Chest clear bilat to bases RRR no MRG Abd soft ntnd no mass or ascites +bs GU normal male w indwelling foley draining clear amber urine Ext no LE or UE edema Neuro is alert, Ox1 , some resting tremors    Home meds:  - atorvastatin 40 qd  - carbidopa-levodopa 10-100 tid/ gabapentin 800 tid  - metformin 500 am/ insulin glargine 27u hs/ semaglutide 1u weekly sq  - losartan 25 qd  - omeprazole 20 qd  - eyedrops/ oxybutynin 5 qd   04/2014 - creat 0.97  10/2016 - creat 1.1- 1.3  Renal US 1/4 - 10.6/ 11.8 cm kidneys, ^echo , no hydro, normal appearing kidneys  UA 05/20/18 > >300 prot, 50 rbc/ 50 wbc per hpf  UNa 1/3 48, UCr 72  Urine sediment today (1/8) > clear urine, 2+ prot, minimal sediment, no rbcs/ 0-5 wbc, clumps of epi's, no casts  A/ P: 1. Renal failure - not sure acute vs chronic.   Renal fxn was normal in 2015, slightly worse in 2018.  Normal kidneys on [place], foley in place.  Repeat UA looks benign. may have HTN'sive renal disease.  Cont IVF, decrease 50/hr, daily creat. Pt is not a dialysis candidate nor candidate for aggressive Rx (e.g. for autoimmune disease) given severe baseline comorbidities. Have d/w pt's wife. Hopefully renal fxn will recover but today creat is up.   2. Dementia 3. Parkinson's 4. DM2 on insulin 5. HTN - home losartan on hold. BP's up, add CCB. Volume ok no excess.     Kelly Splinter MD [facility] pager [phone]   05/25/2018, 11:42 AM   Recent Labs  Lab 05/20/18 1618  05/24/18 0535 05/25/18 0540  NA 142   < > 141 139  K 5.0   < > 3.7 3.7  CL 104   < > 110 110  CO2 21*   < > 22 21*  GLUCOSE 133*   < > 120* 129*  BUN 68*   < > 54* 52*  CREATININE 4.75*   < > 3.74* 3.87*  CALCIUM 9.4   < > 7.8* 7.7*  PHOS  --    < > 3.6 4.3  ALBUMIN 4.4   < > 2.9* 3.1*  INR 0.93  --   --   --    < > =  values in this interval not displayed.   Recent Labs  Lab 05/20/18 1618 05/21/18 0243  AST 23 22  ALT 25 16  ALKPHOS 135* 122  BILITOT 1.1 0.8  PROT 8.4* 7.6   Recent Labs  Lab 05/23/18 0528 05/25/18 0540  WBC 7.7 8.1  NEUTROABS 5.2 5.7  HGB 11.1* 11.5*  HCT 35.1* 35.9*  MCV 92.4 90.0  PLT 177 196

## 2018-05-25 NOTE — Progress Notes (Signed)
Informed by OT that patient had large amount of emesis on him when she came to see that patient. Was told in report that patient had one episode of emesis yesterday as well.  Patient has had poor PO intake today, only a few bites at breakfast.  Denies any pain or feeling nauseated at this time, zofran given.  MD paged and made aware.  Patient won't speak much, mostly shaking head yes/no when asked questions.  Denies any needs at this time, will continue to monitor

## 2018-05-25 NOTE — Progress Notes (Signed)
Occupational Therapy Treatment Patient Details Name: Stephen Frost MRN: [id number] DOB: April 30, 1941 Today's Date: 05/25/2018    History of present illness Pt admitted s/p falls with back pain, UTI and dehydration.  Pt with hx of DM, Parkinsons and dementia   OT comments  No family present this day.  Follow Up Recommendations  SNF    Equipment Recommendations  None recommended by OT    Recommendations for Other Services      Precautions / Restrictions Precautions Precautions: Fall Restrictions Weight Bearing Restrictions: No       Mobility Bed Mobility Overal bed mobility: Needs Assistance Bed Mobility: Supine to Sit;Sit to Supine     Supine to sit: Mod assist Sit to supine: Max assist   General bed mobility comments: pt sat EOB with OT.   Transfers          did not perform            Balance Overall balance assessment: Needs assistance Sitting-balance support: No upper extremity supported;Feet supported Sitting balance-Leahy Scale: Fair                                     ADL either performed or assessed with clinical judgement   ADL Overall ADL's : Needs assistance/impaired                                       General ADL Comments: pt sat up with OT on EOB and vomited several times. RN called and aware.  Pt sat up for approx 10 min as he was dry heaving.  Pt did not vomit again     Vision Patient Visual Report: No change from baseline            Cognition Arousal/Alertness: Awake/alert Behavior During Therapy: Flat affect Overall Cognitive Status: History of cognitive impairments - at baseline Area of Impairment: Safety/judgement;Orientation;Problem solving;Following commands                 Orientation Level: Disoriented to;Place;Time;Situation     Following Commands: Follows one step commands inconsistently Safety/Judgement: Decreased awareness of safety;Decreased awareness of deficits   Problem  Solving: Requires verbal cues;Requires tactile cues                     Pertinent Vitals/ Pain       Faces Pain Scale: No hurt         Frequency  Min 2X/week        Progress Toward Goals  OT Goals(current goals can now be found in the care plan section)  Progress towards OT goals: Not progressing toward goals - comment(pt not feeling well this day)     Plan Discharge plan needs to be updated    Co-evaluation                 AM-PAC OT "6 Clicks" Daily Activity     Outcome Measure   Help from another person eating meals?: A Little Help from another person taking care of personal grooming?: A Little Help from another person toileting, which includes using toliet, bedpan, or urinal?: A Lot Help from another person bathing (including washing, rinsing, drying)?: A Lot Help from another person to put on and taking off regular upper body clothing?: A Lot Help from another person to put on  and taking off regular lower body clothing?: A Lot 6 Click Score: 14    End of Session Equipment Utilized During Treatment: Rolling walker  OT Visit Diagnosis: Unsteadiness on feet (R26.81);History of falling (Z91.81);Muscle weakness (generalized) (M62.81)   Activity Tolerance Patient tolerated treatment well   Patient Left in bed;with nursing/sitter in room;with bed alarm set   Nurse Communication Mobility status        Time: 2902-1115 OT Time Calculation (min): 19 min  Charges: OT General Charges $OT Visit: 1 Visit OT Treatments $Self Care/Home Management : 8-22 mins  Kari Baars, [place] [phone] Office- [phone]      [place], Edwena Felty D 05/25/2018, 4:17 PM

## 2018-05-25 NOTE — Progress Notes (Signed)
PT Cancellation Note  Patient Details Name: WORTH KOBER MRN: [id number] DOB: [birth year]   Cancelled Treatment:    Reason Eval/Treat Not Completed: Attempted PT tx session-pt declined to participate. Awakened pt several times but he would just close his eyes again. He then shook his head "no". Will check back another day.    Weston Anna, PT Acute Rehabilitation Services Pager: [phone] Office: [phone]

## 2018-05-26 LAB — BASIC METABOLIC PANEL
Anion gap: 18 — ABNORMAL HIGH (ref 5–15)
BUN: 55 mg/dL — AB (ref 8–23)
CO2: 15 mmol/L — ABNORMAL LOW (ref 22–32)
Calcium: 8.6 mg/dL — ABNORMAL LOW (ref 8.9–10.3)
Chloride: 112 mmol/L — ABNORMAL HIGH (ref 98–111)
Creatinine, Ser: 4.15 mg/dL — ABNORMAL HIGH (ref 0.61–1.24)
GFR calc Af Amer: 15 mL/min — ABNORMAL LOW (ref >60)
GFR calc non Af Amer: 13 mL/min — ABNORMAL LOW (ref >60)
Glucose, Bld: 158 mg/dL — ABNORMAL HIGH (ref 70–99)
Potassium: 4.2 mmol/L (ref 3.5–5.1)
SODIUM: 145 mmol/L (ref 135–145)

## 2018-05-26 LAB — GLUCOSE, CAPILLARY
GLUCOSE-CAPILLARY: 181 mg/dL — AB (ref 70–99)
Glucose-Capillary: 145 mg/dL — ABNORMAL HIGH (ref 70–99)
Glucose-Capillary: 157 mg/dL — ABNORMAL HIGH (ref 70–99)
Glucose-Capillary: 124 mg/dL — ABNORMAL HIGH (ref 70–99)
Glucose-Capillary: 126 mg/dL — ABNORMAL HIGH (ref 70–99)
Glucose-Capillary: 134 mg/dL — ABNORMAL HIGH (ref 70–99)
Glucose-Capillary: 229 mg/dL — ABNORMAL HIGH (ref 70–99)

## 2018-05-26 LAB — CULTURE, BLOOD (ROUTINE X 2): Culture: NO GROWTH

## 2018-05-26 MED ORDER — NEPRO/CARBSTEADY PO LIQD
237.0000 mL | Freq: Three times a day (TID) | ORAL | Status: DC
  Administered 2018-05-26: 237 mL via ORAL
  Filled 2018-05-26 (×5): qty 237

## 2018-05-26 NOTE — Progress Notes (Signed)
[facility] PROGRESS NOTE  Stephen Frost ZOX:[id number] DOB: 1941-05-02 DOA: 05/20/2018 PCP: Donita Brooks, MD   Narrative:  78 year old male At baseline bedbound frequent falls Parkinson's disease Severe presbycusis Dementia Diabetes mellitus without complication -Found on floor down 3 PM but might of been down prior to that Has been having significant back pain unclear how long he has been down  Emergency room work-up suggestive of UTI-x-rays of the back negative for injury, x-rays also of leg neck [-] Also found to have acute kidney injury with creatinine in the 4 range as baseline is usually below 1 (creatinine was 1.1 on November 01, 2016 according to care everywhere records)  A & Plan Acute metabolic encephalopathy multifactorial 2/2 uremia versus polypharmacy with opiates--- admit lethargic, disoriented and minimally verbal, suspect uremia as a primary culprit  Acute kidney injury- on CKD III--- creatinine was 1.1 on November 01, 2016 according to care everywhere records, patient apparently developed CKD stage III in 2018, this admission creatinine was 4.7 on admission, slight improvement initially with hydration however creatinine remains around for this time--patient remains uremic PTA metformin, losartan Ditropan discontinued  No significant improvement in renal function, uremia symptoms persist, FeNa suggestive intrinsic disease ATN/AIN-nephrology input appreciated,  electrophoresis and other labs C3-C4 complement is negative PSA normal ---Brief w/u was negative for ANCA/ myeloma. Suspect pt is esrd at this point and uremic w/ somnolence.  Not a candidate for dialysis or immunosuppression given underlying comorbidities--- palliative/hospice options discussed with patient's wife official palliative care consult pending  ?aspiration pneumonitis secondary vomiting 1/6 aspiration ---cardiomegaly and effusions on chest x-ray Is +4 L Initially given clindamycin 1/6 but because of  no further episodes no fever curve and no other symptoms or findings, suspect just pneumonitis and would not treat beyond this  Probable urinary retention   indwelling Foley---  Fall with low back pain -Pain control appears adequate  Parkinson's -continue carbidopa-has complications of dementia with this  Dilutional anemia-no evidences of dark stool and is on saline repletion-no further work-up at this time  FEN--- patient is too lethargic to have any significant oral intake  Peripheral neuropathy holding gabapentin as above  Parkinson's disease with dementia on Sinemet 1 tab 3 times daily which we will continue  Hyperlipidemia holding statin at this time  Malnutrition BMI 18  Disposition--- overall prognosis is very poor/[facility], patient will probably need to go to residential hospice  Roxan Hockey, MD  [facility] --Via [employer] app OR  --[email or web address]; password [email or web address]  7PM-7AM contact night coverage as above 05/26/2018, 5:35 PM  LOS: 4 days   Consultants:  Nephrology/Palliative care consult pending  Procedures:  None  Antimicrobials: No  Interval history/Subjective:  Remains mostly nonverbal, lethargic, looks ill, wife at bedside, questions answered   Objective:  Vitals:  Vitals:   05/26/18 1412 05/26/18 1602  BP: (!) 155/89 (!) 168/91  Pulse: 98 (!) 101  Resp: 18 16  Temp: 97.8 F (36.6 C)   SpO2: 90% 91%    Exam:   Physical Exam  Patient is examined daily including today on 05/26/18 , exams remain the same as of yesterday except that has changed   Gen:-Sleepy , not really verbal HEENT:- [place].AT, No sclera icterus Neck-Supple Neck,No JVD,.  Lungs-diminished in bases, no wheezing CV- S1, S2 normal Abd-  +ve B.Sounds, Abd Soft, No tenderness,    Extremity/Skin:-Warm and dry  psych-affect is flat, sleepy and disoriented  neuro-generalized weakness with no new focal deficits, at baseline patient has parkinsonian tremors GU--Foley  with clear  urine  Scheduled Meds: . brimonidine  1 drop Both Eyes TID  . carbidopa-levodopa  1 tablet Oral TID  . enoxaparin (LOVENOX) injection  30 mg Subcutaneous Q24H  . feeding supplement (NEPRO CARB STEADY)  237 mL Oral TID  . latanoprost  1 drop Both Eyes QHS  . thiamine  100 mg Oral Daily  . timolol  1 drop Both Eyes BID   Continuous Infusions:   Active Problems:   Encephalopathy acute   DM type 2 (diabetes mellitus, type 2) (HCC)   UTI (urinary tract infection)   Dementia due to Parkinson's disease with behavioral disturbance (HCC)   AKI (acute kidney injury) ([place])   Dehydration   Malnutrition ([place])   LOS: 4 days

## 2018-05-26 NOTE — Progress Notes (Signed)
[facility] Progress Note  Subjective:  creat up again to 4.0 today.    Vitals:   05/26/18 0427 05/26/18 1018 05/26/18 1200 05/26/18 1412  BP: (!) 176/93 (!) 153/93  (!) 155/89  Pulse: (!) 106 (!) 113  98  Resp: 16 (!) 22 20 18   Temp: 97.9 F (36.6 C) 98 F (36.7 C)  97.8 F (36.6 C)  TempSrc: Oral Oral  Oral  SpO2: 94% 90%  90%  Weight: 55.8 kg     Height:        Inpatient medications: . brimonidine  1 drop Both Eyes TID  . carbidopa-levodopa  1 tablet Oral TID  . enoxaparin (LOVENOX) injection  30 mg Subcutaneous Q24H  . feeding supplement (NEPRO CARB STEADY)  237 mL Oral TID  . latanoprost  1 drop Both Eyes QHS  . thiamine  100 mg Oral Daily  . timolol  1 drop Both Eyes BID   . sodium chloride 50 mL/hr at 05/26/18 0449   acetaminophen **OR** acetaminophen, food thickener, hydrALAZINE, HYDROcodone-acetaminophen, levalbuterol, ondansetron **OR** ondansetron (ZOFRAN) IV  Iron/TIBC/Ferritin/ %Sat No results found for: IRON, TIBC, FERRITIN, IRONPCTSAT  Exam: Gen pt is still wtihdrawn and barely verbal, no distress No jvd or bruits, flat neck veins Chest clear bilat to bases RRR no MRG Abd soft ntnd no mass or ascites +bs GU normal male w indwelling foley draining clear amber urine Ext no LE or UE edema Neuro is alert, Ox1 , some resting tremors    Home meds:  - atorvastatin 40 qd  - carbidopa-levodopa 10-100 tid/ gabapentin 800 tid  - metformin 500 am/ insulin glargine 27u hs/ semaglutide 1u weekly sq  - losartan 25 qd  - omeprazole 20 qd  - eyedrops/ oxybutynin 5 qd   04/2014 - creat 0.97  10/2016 - creat 1.1- 1.3  Renal US 1/4 - 10.6/ 11.8 cm kidneys, ^echo , no hydro, normal appearing kidneys  UA 05/20/18 > >300 prot, 50 rbc/ 50 wbc per hpf  UNa 1/3 48, UCr 72  Urine sediment today (1/8) > clear urine, 2+ prot, minimal sediment, no rbcs/ 0-5 wbc, clumps of epi's, no casts  A/ P: 1. Renal failure - not sure acute vs chronic.  Renal fxn  was normal in 2015, developed CKD 3 in 2018. Here now w/ creat 4.7 on admission and has not responded to IVF's; +4L since admission w creat still 4. Brief w/u was negative for ANCA/ myeloma. Suspect pt is esrd at this point and uremic w/ somnolence.  Not a candidate for dialysis or immunosuppression given underlying comorbidities. Have d/w pt's family who are understanding.  Pt will need hospice, life expectancy out of hospital is short.  Will consult SW.  Please call w questions.  2. Dementia 3. Parkinson's 4. DM2 on insulin 5. HTN     Kelly Splinter MD [facility] pager [phone]   05/26/2018, 2:23 PM   Recent Labs  Lab 05/20/18 1618  05/24/18 0535 05/25/18 0540 05/26/18 0603  NA 142   < > 141 139 145  K 5.0   < > 3.7 3.7 4.2  CL 104   < > 110 110 112*  CO2 21*   < > 22 21* 15*  GLUCOSE 133*   < > 120* 129* 158*  BUN 68*   < > 54* 52* 55*  CREATININE 4.75*   < > 3.74* 3.87* 4.15*  CALCIUM 9.4   < > 7.8* 7.7* 8.6*  PHOS  --    < >  3.6 4.3  --   ALBUMIN 4.4   < > 2.9* 3.1*  --   INR 0.93  --   --   --   --    < > = values in this interval not displayed.   Recent Labs  Lab 05/20/18 1618 05/21/18 0243  AST 23 22  ALT 25 16  ALKPHOS 135* 122  BILITOT 1.1 0.8  PROT 8.4* 7.6   Recent Labs  Lab 05/23/18 0528 05/25/18 0540  WBC 7.7 8.1  NEUTROABS 5.2 5.7  HGB 11.1* 11.5*  HCT 35.1* 35.9*  MCV 92.4 90.0  PLT 177 196

## 2018-05-26 NOTE — Progress Notes (Signed)
Nutrition Follow-up  DOCUMENTATION CODES:   Not applicable  INTERVENTION:  - Continue Magic Cup TID and Hormel Shake once/day. - Continue Nepro Shake TID, each supplement provides 425 kcal and 19 grams protein. - Continue to encourage PO intakes.   NUTRITION DIAGNOSIS:   Inadequate oral intake related to dysphagia as evidenced by per patient/family report. -ongoing  GOAL:   Patient will meet greater than or equal to 90% of their needs -unmet on average  MONITOR:   PO intake, Supplement acceptance, Labs, Weight trends, I & O's  ASSESSMENT:   78 y.o. male with medical history significant of dementia and Parkinson diabetes mellitus, HLD. Admitted for Acute metabolic encephalopathy .  Current weight consistent with admission weight. Patient has been refusing many meals and otherwise eating 25-50% of meals since RD assessment on 1/5. Nepro was ordered TID today; patient has not yet received this supplement. Will monitor for acceptance of Nepro.   Per Dr. Barkley Bruns note today: patient is not a dialysis candidate and not a candidate for aggressive treatment d/t severe baseline comorbidities.      Medications reviewed; 100 mg oral thiamine/day.  Labs reviewed; CBGs: 124-157 mg/dL today, Cl; 112 mmol/L, BUN: 55 mg/dL, creatinine: 4.15 mg/dL, Ca: 8.6 mg/dL, GFR: 13 mL/min.  IVF; NS @ 50 mL/hr.     Diet Order:   Diet Order            DIET DYS 2 Room service appropriate? No; Fluid consistency: Nectar Thick  Diet effective now              EDUCATION NEEDS:   Not appropriate for education at this time  Skin:  Skin Assessment: Reviewed RN Assessment  Last BM:  1/7  Height:   Ht Readings from Last 1 Encounters:  05/21/18 5\' 6"  (1.676 m)    Weight:   Wt Readings from Last 1 Encounters:  05/26/18 55.8 kg    Ideal Body Weight:  64.5 kg  BMI:  Body mass index is 19.85 kg/m.  Estimated Nutritional Needs:   Kcal:  1400-1600  Protein:  65-75g  Fluid:   1.6L/day     Jarome Matin, MS, RD, LDN, CNSC Inpatient Clinical Dietitian Pager # [phone] After hours/weekend pager # [phone]

## 2018-05-26 NOTE — Progress Notes (Signed)
PT Cancellation Note  Patient Details Name: Stephen Frost MRN: [id number] DOB: [birth year]   Cancelled Treatment:    Reason Eval/Treat Not Completed: Per nephrology, plan of care is for hospice. Spoke with family who requested PT sign off at this time.    Weston Anna, PT Acute Rehabilitation Services Pager: [phone] Office: [phone]

## 2018-05-27 DIAGNOSIS — N179 Acute kidney failure, unspecified: Principal | ICD-10-CM

## 2018-05-27 DIAGNOSIS — Z515 Encounter for palliative care: Secondary | ICD-10-CM

## 2018-05-27 DIAGNOSIS — G20 Parkinson's disease: Secondary | ICD-10-CM

## 2018-05-27 DIAGNOSIS — F0281 Dementia in other diseases classified elsewhere with behavioral disturbance: Secondary | ICD-10-CM

## 2018-05-27 DIAGNOSIS — Z7189 Other specified counseling: Secondary | ICD-10-CM

## 2018-05-27 LAB — BASIC METABOLIC PANEL
Anion gap: 13 (ref 5–15)
BUN: 63 mg/dL — ABNORMAL HIGH (ref 8–23)
CO2: 18 mmol/L — ABNORMAL LOW (ref 22–32)
Calcium: 8.8 mg/dL — ABNORMAL LOW (ref 8.9–10.3)
Chloride: 116 mmol/L — ABNORMAL HIGH (ref 98–111)
Creatinine, Ser: 4.62 mg/dL — ABNORMAL HIGH (ref 0.61–1.24)
GFR calc Af Amer: 13 mL/min — ABNORMAL LOW (ref >60)
GFR calc non Af Amer: 12 mL/min — ABNORMAL LOW (ref >60)
Glucose, Bld: 170 mg/dL — ABNORMAL HIGH (ref 70–99)
Potassium: 3.8 mmol/L (ref 3.5–5.1)
SODIUM: 147 mmol/L — AB (ref 135–145)

## 2018-05-27 LAB — CBC
HCT: 41.7 % (ref 39.0–52.0)
Hemoglobin: 13 g/dL (ref 13.0–17.0)
MCH: 29.5 pg (ref 26.0–34.0)
MCHC: 31.2 g/dL (ref 30.0–36.0)
MCV: 94.8 fL (ref 80.0–100.0)
NRBC: 0 % (ref 0.0–0.2)
PLATELETS: 240 10*3/uL (ref 150–400)
RBC: 4.4 MIL/uL (ref 4.22–5.81)
RDW: 15 % (ref 11.5–15.5)
WBC: 12 10*3/uL — ABNORMAL HIGH (ref 4.0–10.5)

## 2018-05-27 LAB — GLUCOSE, CAPILLARY
Glucose-Capillary: 150 mg/dL — ABNORMAL HIGH (ref 70–99)
Glucose-Capillary: 154 mg/dL — ABNORMAL HIGH (ref 70–99)
Glucose-Capillary: 212 mg/dL — ABNORMAL HIGH (ref 70–99)
Glucose-Capillary: 187 mg/dL — ABNORMAL HIGH (ref 70–99)
Glucose-Capillary: 149 mg/dL — ABNORMAL HIGH (ref 70–99)

## 2018-05-27 MED ORDER — ONDANSETRON HCL 4 MG/2ML IJ SOLN: 4.0000 mg | Freq: Four times a day (QID) | INTRAMUSCULAR | Status: DC | PRN

## 2018-05-27 MED ORDER — LORAZEPAM 2 MG/ML PO CONC: 1.0000 mg | ORAL | Status: DC | PRN

## 2018-05-27 MED ORDER — LORAZEPAM 2 MG/ML IJ SOLN: 1.0000 mg | INTRAMUSCULAR | Status: DC | PRN

## 2018-05-27 MED ORDER — BIOTENE DRY MOUTH MT LIQD: 15.0000 mL | OROMUCOSAL | Status: DC | PRN

## 2018-05-27 MED ORDER — SODIUM CHLORIDE 0.9 % IV SOLN
12.5000 mg | Freq: Four times a day (QID) | INTRAVENOUS | Status: DC | PRN
  Filled 2018-05-27: qty 0.5

## 2018-05-27 MED ORDER — ATROPINE SULFATE 1 % OP SOLN
4.0000 [drp] | OPHTHALMIC | Status: DC | PRN
  Filled 2018-05-27: qty 2

## 2018-05-27 MED ORDER — RISPERIDONE 0.5 MG PO TBDP
0.5000 mg | ORAL_TABLET | Freq: Two times a day (BID) | ORAL | Status: DC
  Administered 2018-05-27 – 2018-05-28 (×2): 0.5 mg via ORAL
  Filled 2018-05-27 (×4): qty 1

## 2018-05-27 MED ORDER — POLYVINYL ALCOHOL 1.4 % OP SOLN
1.0000 [drp] | Freq: Four times a day (QID) | OPHTHALMIC | Status: DC | PRN
  Filled 2018-05-27: qty 15

## 2018-05-27 MED ORDER — MORPHINE SULFATE (CONCENTRATE) 10 MG/0.5ML PO SOLN
5.0000 mg | ORAL | Status: DC | PRN
  Administered 2018-05-30: 5 mg via SUBLINGUAL

## 2018-05-27 MED ORDER — BISACODYL 10 MG RE SUPP: 10.0000 mg | Freq: Every day | RECTAL | Status: DC | PRN

## 2018-05-27 MED ORDER — LORAZEPAM 1 MG PO TABS: 1.0000 mg | ORAL_TABLET | ORAL | Status: DC | PRN

## 2018-05-27 MED ORDER — ACETAMINOPHEN 325 MG PO TABS: 650.0000 mg | ORAL_TABLET | Freq: Four times a day (QID) | ORAL | Status: DC | PRN

## 2018-05-27 MED ORDER — MORPHINE SULFATE (CONCENTRATE) 10 MG/0.5ML PO SOLN
5.0000 mg | ORAL | Status: DC | PRN
  Filled 2018-05-27: qty 0.5

## 2018-05-27 MED ORDER — ACETAMINOPHEN 650 MG RE SUPP: 650.0000 mg | Freq: Four times a day (QID) | RECTAL | Status: DC | PRN

## 2018-05-27 MED ORDER — ALBUTEROL SULFATE (2.5 MG/3ML) 0.083% IN NEBU: 2.5000 mg | INHALATION_SOLUTION | RESPIRATORY_TRACT | Status: DC | PRN

## 2018-05-27 MED ORDER — ONDANSETRON 4 MG PO TBDP: 4.0000 mg | ORAL_TABLET | Freq: Four times a day (QID) | ORAL | Status: DC | PRN

## 2018-05-27 NOTE — Consult Note (Signed)
Consultation Note Date: 05/27/2018   Patient Name: Stephen Frost  DOB: Jan 29, 1941  MRN: [id number]  Age / Sex: 78 y.o., male  PCP: Stephen Brooks, MD Referring Physician: Roxan Hockey, MD  Reason for Consultation: Establishing goals of care  HPI/Patient Profile: 78 y.o. male   admitted on 05/20/2018     Clinical Assessment and Goals of Care: 78 yo with dementia and Parkinson's disease. He lives at home with his wife. They have been married for 2 years, how ever, they have known each other since 1999. The patient is a veteran, he has a son and daughter. He was using walker and also wheel chair. He was getting weaker, he was having falls.   The patient is now admitted to the hospital medicine service with acute metabolic encephalopathy multifactorial 2/2 uremia. Hospital course complicated by ongoing renal failure. Renal services have been following. Hospital course also complicated by aspiration pneumonitis.   Patient has been deemed not an appropriate candidate to be initiated on dialysis, his oral intake is declining.   A palliative consult has been requested.   The patient is resting in bed. He is awake but not alert. He doesn't verbalize. Only taking in few sips/bites. I met with his wife at the bedside. I introduced myself and palliative care as follows: Palliative medicine is specialized medical care for people living with serious illness. It focuses on providing relief from the symptoms and stress of a serious illness. The goal is to improve quality of life for both the patient and the family.  We reviewed about goals, wishes and values. Introduced hospice philosophy of care, discussed about the type of care provided in a residential hospice setting. Wife is agreeable. All of her questions addressed to the best of my ability. See below.   HCPOA  wife Stephen Frost is HCPOA. The patient's wife  has his living will and his HCPOA documentation with her, which was reviewed.  Patient has a son who lives in [place], he has a daughter who lives in [place], [place].  The patient is a English as a second language teacher, he has served in both [place] and [place]. He is thanked for his service to our country.    SUMMARY OF RECOMMENDATIONS   DNR DNI Comfort measures CSW consult for residential hospice Continue current mode of care.   Code Status/Advance Care Planning:  DNR    Symptom Management:   As above   Palliative Prophylaxis:   Delirium Protocol Psycho-social/Spiritual:   Desire for further Chaplaincy support:yes  Additional Recommendations: Caregiving  Support/Resources  Prognosis:   < 2 weeks  Discharge Planning: Hospice facility      Primary Diagnoses: Present on Admission: . UTI (urinary tract infection) . Encephalopathy acute . Dementia due to Parkinson's disease with behavioral disturbance (Stephen Frost) . AKI (acute kidney injury) (Stephen Frost) . Dehydration . Malnutrition (Stephen Frost)   I have reviewed the medical record, interviewed the patient and family, and examined the patient. The following aspects are pertinent.  Past Medical History:  Diagnosis Date  . Acid reflux   .  Arthritis    in spine  . Cataract 2012   bilateral eye   . Dementia (Stephen Frost) 05/20/2018   Per EMS and caregiver  . Diabetes mellitus without complication (Stephen Frost)    type 2  . Elevated cholesterol   . Melanoma (Stephen Frost)    foot  . Pneumonia    Social History   Socioeconomic History  . Marital status: Married    Spouse name: 2  . Number of children: Not on file  . Years of education: Not on file  . Highest education level: Not on file  Occupational History  . Not on file  Social Needs  . Financial resource strain: Not on file  . Food insecurity:    Worry: Not on file    Inability: Not on file  . Transportation needs:    Medical: Not on file    Non-medical: Not on file  Tobacco Use  . Smoking status: Never Smoker  .  Smokeless tobacco: Never Used  Substance and Sexual Activity  . Alcohol use: No  . Drug use: No  . Sexual activity: Not Currently  Lifestyle  . Physical activity:    Days per week: Not on file    Minutes per session: Not on file  . Stress: Not on file  Relationships  . Social connections:    Talks on phone: Not on file    Gets together: Not on file    Attends religious service: Not on file    Active member of club or organization: Not on file    Attends meetings of clubs or organizations: Not on file    Relationship status: Not on file  Other Topics Concern  . Not on file  Social History Narrative  . Not on file   Family History  Problem Relation Age of Onset  . Alcoholism Mother   . Heart attack Father   . Colon cancer Neg Hx   . Esophageal cancer Neg Hx   . Stomach cancer Neg Hx   . Rectal cancer Neg Hx    Scheduled Meds: Continuous Infusions: PRN Meds:.acetaminophen **OR** acetaminophen, food thickener, HYDROcodone-acetaminophen, levalbuterol, ondansetron **OR** ondansetron (ZOFRAN) IV Medications Prior to Admission:  Prior to Admission medications   Medication Sig Start Date End Date Taking? Authorizing Provider  atorvastatin (LIPITOR) 40 MG tablet Take 40 mg by mouth daily.   Yes [provider]  Brinzolamide-Brimonidine (SIMBRINZA) 1-0.2 % SUSP Apply 1 drop to eye 3 (three) times daily.   Yes [provider]  carbidopa-levodopa (SINEMET IR) 10-100 MG tablet Take 1 tablet by mouth 3 (three) times daily.   Yes [provider]  gabapentin (NEURONTIN) 800 MG tablet Take 800 mg by mouth 3 (three) times daily.   Yes [provider]  glucose 4 GM chewable tablet Chew 1 tablet by mouth as needed for low blood sugar.   Yes [provider]  insulin glargine (LANTUS) 100 UNIT/ML injection Inject 0.3 mLs (30 Units total) into the skin daily. Patient taking differently: Inject 27 Units into the skin every evening.  04/30/14  Yes Vann,  Jessica U, DO  latanoprost (XALATAN) 0.005 % ophthalmic solution 1 drop at bedtime.   Yes [provider]  losartan (COZAAR) 50 MG tablet Take 25 mg by mouth daily.   Yes [provider]  metformin (FORTAMET) 500 MG (OSM) 24 hr tablet Take 500 mg by mouth daily with breakfast.   Yes [provider]  omeprazole (PRILOSEC) 20 MG capsule Take 20 mg by  mouth daily.   Yes [provider]  oxybutynin (DITROPAN) 5 MG tablet Take 5 mg by mouth daily.   Yes [provider]  SEMAGLUTIDE, 1 MG/DOSE, [place] Inject 1 Units into the skin once a week. Monday   Yes [provider]  timolol (TIMOPTIC) 0.5 % ophthalmic solution 1 drop 2 (two) times daily.   Yes [provider]   Allergies  Allergen Reactions  . Insulin Lispro Other (See Comments)  . Metformin And Related Diarrhea   Review of Systems Non verbal  Physical Exam Awake but not alert Non verbal  diminished breath sounds S 1 S 2  Abdomen is soft non tender He has some edema in both UE and LE Patient has foley  Vital Signs: BP (!) 142/76 (BP Location: Right Arm)   Pulse (!) 101   Temp 97.7 F (36.5 C) (Oral)   Resp 17   Ht 5' 6"  (1.676 m)   Wt 56.9 kg   SpO2 94%   BMI 20.25 kg/m  Pain Scale: PAINAD POSS *See Group Information*: S-Acceptable,Sleep, easy to arouse Pain Score: 0-No pain   SpO2: SpO2: 94 % O2 Device:SpO2: 94 % O2 Flow Rate: .O2 Flow Rate (L/min): 2 L/min  IO: Intake/output summary:   Intake/Output Summary (Last 24 hours) at 05/27/2018 1039 Last data filed at 05/27/2018 3568 Gross per 24 hour  Intake 617.42 ml  Output 750 ml  Net -132.58 ml    LBM: Last BM Date: 05/24/18 Baseline Weight: Weight: 55 kg Most recent weight: Weight: 56.9 kg     Palliative Assessment/Data:   Flowsheet Rows     Most Recent Value  Intake Tab  Referral Department  Hospitalist  Unit at Time of Referral  Med/Surg Unit  Date Notified  05/26/18  Palliative Care Type   New Palliative care  Reason for referral  Clarify Goals of Care  Date of Admission  05/22/18  # of days IP prior to Palliative referral  4  Clinical Assessment  Psychosocial & Spiritual Assessment  Palliative Care Outcomes     PPS 10%  Time In: 9.30 Time Out: 10.30 Time Total:  60 min  Greater than 50%  of this time was spent counseling and coordinating care related to the above assessment and plan.  Signed by: Loistine Chance, MD [phone]  Please contact Palliative Medicine Team phone at [phone] for questions and concerns.  For individual provider: See Shea Evans

## 2018-05-27 NOTE — Progress Notes (Signed)
[facility] PROGRESS NOTE  Stephen Frost VVO:[id number] DOB: 1941-01-13 DOA: 05/20/2018 PCP: Donita Brooks, MD   Narrative:  78 year old male At baseline bedbound frequent falls Parkinson's disease Severe presbycusis Dementia Diabetes mellitus without complication -Found on floor down 3 PM but might of been down prior to that Has been having significant back pain unclear how long he has been down  Emergency room work-up suggestive of UTI-x-rays of the back negative for injury, x-rays also of leg neck [-] Also found to have acute kidney injury with creatinine in the 4 range as baseline is usually below 1 (creatinine was 1.1 on November 01, 2016 according to care everywhere records) comfort measures only palliative and hospice consult appreciated, awaiting transfer to residential hospice house  A & Plan Acute metabolic encephalopathy multifactorial 2/2 uremia versus polypharmacy with opiates--- admit lethargic, disoriented and minimally verbal, suspect uremia as a primary culprit- comfort measures only palliative and hospice consult appreciated, awaiting transfer to residential hospice house  Acute kidney injury- on CKD III--- creatinine was 1.1 on November 01, 2016 according to care everywhere records, patient apparently developed CKD stage III in 2018, this admission creatinine was 4.7 on admission, creatinine back up over  4.6 time--patient remains uremic  PTA metformin, losartan Ditropan discontinued  No significant improvement in renal function, uremia symptoms persist, FeNa suggestive intrinsic disease ATN/AIN-nephrology input appreciated,  electrophoresis and other labs C3-C4 complement is negative PSA normal ---Brief w/u was negative for ANCA/ myeloma. Suspect pt is esrd at this point and uremic w/ somnolence.  Not a candidate for dialysis or immunosuppression given underlying comorbidities--- palliative/hospice options discussed with patient's wife, comfort measures only palliative  and hospice consult appreciated, awaiting transfer to residential hospice house  ?aspiration pneumonitis secondary vomiting 1/6 aspiration ---cardiomegaly and effusions on chest x-ray Is +4 L Initially given clindamycin 1/6 but because of no further episodes no fever curve and no other symptoms or findings, suspect just pneumonitis and would not treat beyond this--- comfort measures only palliative and hospice consult appreciated, awaiting transfer to residential hospice house  Fall with low back pain -Pain control appears adequate- comfort measures only palliative and hospice consult appreciated, awaiting transfer to residential hospice house  FEN--- patient is too lethargic to have any significant oral intake   Parkinson's disease with dementia comfort measures only palliative and hospice consult appreciated, awaiting transfer to residential hospice house   Malnutrition BMI 18--- comfort measures only palliative and hospice consult appreciated, awaiting transfer to residential hospice house  Disposition--- overall prognosis is very poor/[place], awaiting transfer to residential hospice  Stephen Hockey, MD  [facility] --Via [employer] app OR  --[email or web address]; password [email or web address]  7PM-7AM contact night coverage as above 05/27/2018, 4:30 PM  LOS: 5 days   Consultants:  Nephrology/Palliative care consult/hospice consult  Procedures:  None  Antimicrobials: No  Interval history/Subjective:  Remains mostly nonverbal, lethargic, looks ill, wife at bedside, questions answered   Objective:  Vitals:  Vitals:   05/27/18 0402 05/27/18 1306  BP: (!) 142/76 (!) 177/94  Pulse: (!) 101 (!) 102  Resp: 17 18  Temp: 97.7 F (36.5 C) 99.6 F (37.6 C)  SpO2: 94% 92%    Exam:   Physical Exam  Patient is examined daily including today on 05/27/18 , exams remain the same as of yesterday except that has changed   Gen:-Sleepy , not really verbal, appears comfortable HEENT:- [place] .AT,  No sclera icterus Neck-Supple Neck,No JVD,.  Lungs-diminished in bases, no wheezing CV- S1, S2 normal Abd-  +  ve B.Sounds, Abd Soft, No tenderness,    Extremity/Skin:-Warm and dry  psych-affect is flat, sleepy and disoriented  neuro-generalized weakness with no new focal deficits, at baseline patient has parkinsonian tremors GU--Foley with clear urine  Scheduled Meds: . risperiDONE  0.5 mg Oral BID   Continuous Infusions: . chlorproMAZINE (THORAZINE) IV      Active Problems:   Encephalopathy acute   DM type 2 (diabetes mellitus, type 2) (HCC)   UTI (urinary tract infection)   Dementia due to Parkinson's disease with behavioral disturbance (HCC)   AKI (acute kidney injury) ([place])   Dehydration   Malnutrition ([place])   Acute renal failure ([place])   Palliative care by specialist   Goals of care, counseling/discussion   LOS: 5 days

## 2018-05-27 NOTE — Progress Notes (Signed)
[facility])   Received request from [place], [place] for family interest in [facility]. Chart reviewed and spoke with family to acknowledge referral.  Unfortunately [facility] is not able to offer a room today.  Family would also consider [facility] as they have family in the [place] area.  [facility] Liaisons are aware of this and will follow up if they have availability.  Family and CSW are aware [facility] liaison will follow up with CSW and family tomorrow or sooner if room becomes available at either location. Please do not hesitate to call with questions.  Thank you,   Farrel Gordon, RN, [facility] Liaison  [place] are on AMION

## 2018-05-27 NOTE — Progress Notes (Signed)
Clinical Social Worker acknowledge consult to assist patient in being placed into a residential hospice facility. CSW spoke with patients wife outside of patients room. Wife stated she is agreeable for patient to dishcrage to a hospice facility and would prefer [facility] since its close to there home. Wife stated if [facility] is unable to take patient she would like [facility] to be a second option since the facility is close to patient's daughter.  CSW reached out to [facility] and made a referral on behalf of patient. Maryanne stated [facility] is full at the moment but she stated she will reach out to [facility] to check there bed availability. Latanya Presser stated she will reach out tot family to make them aware of bed availability.   Rhea Pink, MSW,  [place]

## 2018-05-27 NOTE — Progress Notes (Signed)
  Speech Language Pathology Treatment: Dysphagia  Patient Details Name: Stephen Frost MRN: [id number] DOB: [birth year] Today's Date: 05/27/2018 Time: 6546-5035 SLP Time Calculation (min) (ACUTE ONLY): 10 min  Assessment / Plan / Recommendation Clinical Impression  Pt transitioning to comfort care. Wife and daughter at bedside. Nursing reports tolerance of current (dys 2/nectar thick liquid) diet, however, minimal intake. Family was offered the opportunity to ask questions regarding swallow safety. Pt exhibited congested, nonproductive cough, so SLP set up suction to facilitate oral care and removal of secretions pty may cough up.  No further skilled ST intervention recommended at this time. Will sign off. Please reconsult if needs arise.    HPI HPI: 78 year old male with h/o Parkinson's disease, dementia, admitted wtih acute metabolic encephalopathy multifactorial but most likely secondary to uremia. Patient had an OP MBS 02/2018-moderate oropharyngeal dysphagia with silent aspiration of thin liquids, large [place] diverticulum with retrograde flow of thin liquids to the pyriform sinuses. Regular solids, nectar thick liquid recommended, meds crushed in puree.       SLP Plan  Discharge SLP treatment due to goals met       Recommendations  Diet recommendations: Dysphagia 2 (fine chop);Nectar-thick liquid Liquids provided via: Cup;No straw Medication Administration: Crushed with puree Supervision: Full supervision/cueing for compensatory strategies Compensations: Slow rate;Small sips/bites Postural Changes and/or Swallow Maneuvers: Seated upright 90 degrees                Oral Care Recommendations: Oral care BID Follow up Recommendations: None SLP Visit Diagnosis: Dysphagia, oropharyngeal phase (R13.12) Plan: All goals met;Discharge SLP treatment due to (comment)       GO              Celia B. Quentin Ore [facility], CCC-SLP Speech Language Pathologist [phone]  Shonna Chock 05/27/2018, 1:07 PM

## 2018-05-28 LAB — CBC
HCT: 40.6 % (ref 39.0–52.0)
Hemoglobin: 12.6 g/dL — ABNORMAL LOW (ref 13.0–17.0)
MCH: 28.6 pg (ref 26.0–34.0)
MCHC: 31 g/dL (ref 30.0–36.0)
MCV: 92.3 fL (ref 80.0–100.0)
Platelets: 242 10*3/uL (ref 150–400)
RBC: 4.4 MIL/uL (ref 4.22–5.81)
RDW: 15.1 % (ref 11.5–15.5)
WBC: 9 10*3/uL (ref 4.0–10.5)
nRBC: 0 % (ref 0.0–0.2)

## 2018-05-28 LAB — GLUCOSE, CAPILLARY
GLUCOSE-CAPILLARY: 171 mg/dL — AB (ref 70–99)
Glucose-Capillary: 164 mg/dL — ABNORMAL HIGH (ref 70–99)
Glucose-Capillary: 170 mg/dL — ABNORMAL HIGH (ref 70–99)
Glucose-Capillary: 172 mg/dL — ABNORMAL HIGH (ref 70–99)
Glucose-Capillary: 175 mg/dL — ABNORMAL HIGH (ref 70–99)
Glucose-Capillary: 275 mg/dL — ABNORMAL HIGH (ref 70–99)

## 2018-05-28 LAB — BASIC METABOLIC PANEL
Anion gap: 9 (ref 5–15)
BUN: 67 mg/dL — ABNORMAL HIGH (ref 8–23)
CO2: 19 mmol/L — ABNORMAL LOW (ref 22–32)
Calcium: 8.1 mg/dL — ABNORMAL LOW (ref 8.9–10.3)
Chloride: 119 mmol/L — ABNORMAL HIGH (ref 98–111)
Creatinine, Ser: 4.59 mg/dL — ABNORMAL HIGH (ref 0.61–1.24)
GFR calc Af Amer: 13 mL/min — ABNORMAL LOW (ref >60)
GFR, EST NON AFRICAN AMERICAN: 12 mL/min — AB (ref >60)
GLUCOSE: 321 mg/dL — AB (ref 70–99)
Potassium: 3.4 mmol/L — ABNORMAL LOW (ref 3.5–5.1)
Sodium: 147 mmol/L — ABNORMAL HIGH (ref 135–145)

## 2018-05-28 NOTE — Progress Notes (Signed)
[facility] -- [facility] RN Visit  Unfortunately, [facility] is not able to offer a room today.  Family and Ria Comment, [place] notified and are aware [facility] Liaison will follow up with CSW and family tomorrow or sooner if room becomes available.  Please do not hesitate to call with questions.  Thank you, Margaretmary Eddy, RN, [facility] Liaison [phone]  [place] are on AMION.

## 2018-05-28 NOTE — Progress Notes (Signed)
[facility] HOSPITALIST PROGRESS NOTE  EOIN WILLDEN KDX:[id number] DOB: 1941-03-20 DOA: 05/20/2018 PCP: Donita Brooks, MD   Narrative:  78 year old male At baseline bedbound frequent falls Parkinson's disease Severe presbycusis Dementia Diabetes mellitus without complication -Found on floor down 3 PM but might of been down prior to that Has been having significant back pain unclear how long he has been down  Emergency room work-up suggestive of UTI-x-rays of the back negative for injury, x-rays also of leg neck [-] Also found to have acute kidney injury with creatinine in the 4 range as baseline is usually below 1 (creatinine was 1.1 on November 01, 2016 according to care everywhere records) comfort measures only palliative and hospice consult appreciated, awaiting transfer to residential hospice house  A & Plan Acute metabolic encephalopathy multifactorial 2/2 uremia versus polypharmacy with opiates--- admit lethargic, disoriented and minimally verbal, suspect uremia as a primary culprit- comfort measures only palliative and hospice consult appreciated, awaiting transfer to residential hospice house  Acute kidney injury- on CKD III--- creatinine was 1.1 on November 01, 2016 according to care everywhere records, patient apparently developed CKD stage III in 2018, this admission creatinine was 4.7 on admission, creatinine back up over  4.6 time--patient remains uremic  PTA metformin, losartan Ditropan discontinued  No significant improvement in renal function, uremia symptoms persist, FeNa suggestive intrinsic disease ATN/AIN-nephrology input appreciated,  electrophoresis and other labs C3-C4 complement is negative PSA normal ---Brief w/u was negative for ANCA/ myeloma. Suspect pt is esrd at this point and uremic w/ somnolence.  Not a candidate for dialysis or immunosuppression given underlying comorbidities--- palliative/hospice options discussed with patient's wife, comfort measures only palliative  and hospice consult appreciated, awaiting transfer to residential hospice house  ?aspiration pneumonitis secondary vomiting 1/6 aspiration ---cardiomegaly and effusions on chest x-ray Is +4 L Initially given clindamycin 1/6 but because of no further episodes no fever curve and no other symptoms or findings, suspect just pneumonitis and would not treat beyond this--- comfort measures only palliative and hospice consult appreciated, awaiting transfer to residential hospice house  Fall with low back pain -Pain control appears adequate- comfort measures only palliative and hospice consult appreciated, awaiting transfer to residential hospice house  FEN--- able to take some oral intake   Parkinson's disease with dementia comfort measures only palliative and hospice consult appreciated, awaiting transfer to residential hospice house   Malnutrition BMI 18--- comfort measures only palliative and hospice consult appreciated, awaiting transfer to residential hospice house  Disposition--- overall prognosis is very poor/[place], awaiting transfer to residential hospice  Roxan Hockey, MD  [facility] --Via [employer] app OR  --[email or web address]; password [email or web address]  7PM-7AM contact night coverage as above 05/28/2018, 3:06 PM  LOS: 6 days   Consultants:  Nephrology/Palliative care consult/hospice consult  Procedures:  None  Antimicrobials: No  Interval history/Subjective: More awake and more verbal today than in the last several days wife at bedside, questions answered  Wife is requesting repeat CBC and BMP/time today as patient is more awake and more interactive today, taking oral intake much better  Objective:  Vitals:  Vitals:   05/28/18 0438 05/28/18 1307  BP: (!) 195/105 (!) 185/99  Pulse: (!) 110 (!) 109  Resp: 18 16  Temp: 97.9 F (36.6 C) 98.1 F (36.7 C)  SpO2: 93% 92%    Exam:   Physical Exam  Patient is examined daily including today on 05/28/18 , exams remain the  same as of yesterday except that has changed   Gen:-More awake and  more verbal today than in the last several days , appears comfortable HEENT:- [place].AT, No sclera icterus Neck-Supple Neck,No JVD,.  Lungs-diminished in bases, no wheezing CV- S1, S2 normal Abd-  +ve B.Sounds, Abd Soft, No tenderness,    Extremity/Skin:-Warm and dry  psych-affect is flat, much less sleepy   neuro-generalized weakness with no new focal deficits, at baseline patient has parkinsonian tremors GU--Foley with clear urine  Scheduled Meds: . risperiDONE  0.5 mg Oral BID   Continuous Infusions: . chlorproMAZINE (THORAZINE) IV      Active Problems:   Encephalopathy acute   DM type 2 (diabetes mellitus, type 2) (HCC)   UTI (urinary tract infection)   Dementia due to Parkinson's disease with behavioral disturbance (HCC)   AKI (acute kidney injury) ([place])   Dehydration   Malnutrition ([place])   Acute renal failure ([place])   Palliative care by specialist   Goals of care, counseling/discussion   LOS: 6 days

## 2018-05-29 DIAGNOSIS — E860 Dehydration: Secondary | ICD-10-CM

## 2018-05-29 LAB — GLUCOSE, CAPILLARY
GLUCOSE-CAPILLARY: 160 mg/dL — AB (ref 70–99)
Glucose-Capillary: 261 mg/dL — ABNORMAL HIGH (ref 70–99)
Glucose-Capillary: 168 mg/dL — ABNORMAL HIGH (ref 70–99)
Glucose-Capillary: 159 mg/dL — ABNORMAL HIGH (ref 70–99)
Glucose-Capillary: 205 mg/dL — ABNORMAL HIGH (ref 70–99)
Glucose-Capillary: 182 mg/dL — ABNORMAL HIGH (ref 70–99)
Glucose-Capillary: 179 mg/dL — ABNORMAL HIGH (ref 70–99)

## 2018-05-29 MED ORDER — POTASSIUM CHLORIDE IN NACL 20-0.45 MEQ/L-% IV SOLN
INTRAVENOUS | Status: DC
  Administered 2018-05-29 – 2018-05-31 (×6): via INTRAVENOUS
  Filled 2018-05-29 (×7): qty 1000

## 2018-05-29 MED ORDER — RISPERIDONE 0.5 MG PO TBDP
0.5000 mg | ORAL_TABLET | Freq: Every day | ORAL | Status: DC
  Filled 2018-05-29 (×2): qty 1

## 2018-05-29 NOTE — Progress Notes (Signed)
[facility] HOSPITALIST PROGRESS NOTE  Stephen Frost:[id number] DOB: Jul 03, 1940 DOA: 05/20/2018 PCP: Donita Brooks, MD   Narrative:  78 year old male At baseline bedbound frequent falls Parkinson's disease Severe presbycusis Dementia Diabetes mellitus without complication -Found on floor down 3 PM but might of been down prior to that Has been having significant back pain unclear how long he has been down  Emergency room work-up suggestive of UTI-x-rays of the back negative for injury, x-rays also of leg neck [-] Also found to have acute kidney injury with creatinine in the 4 range as baseline is usually below 1 (creatinine was 1.1 on November 01, 2016 according to care everywhere records) comfort measures only palliative and hospice consult appreciated   A & Plan Acute metabolic encephalopathy multifactorial 2/2 uremia versus polypharmacy with opiates--- admit lethargic, disoriented and minimally verbal, suspect uremia as a primary culprit- comfort measures only palliative and hospice consult appreciated,  Acute kidney injury- on CKD III--- creatinine was 1.1 on November 01, 2016 according to care everywhere records, patient apparently developed CKD stage III in 2018, this admission creatinine was 4.7 on admission, creatinine back up over  4.6 time--patient remains uremic  PTA metformin, losartan Ditropan discontinued  No significant improvement in renal function, uremia symptoms persist, FeNa suggestive intrinsic disease ATN/AIN-nephrology input appreciated,  electrophoresis and other labs C3-C4 complement is negative PSA normal ---Brief w/u was negative for ANCA/ myeloma. Suspect pt is esrd at this point and uremic w/ somnolence.  Not a candidate for dialysis or immunosuppression given underlying comorbidities--- palliative/hospice options discussed with patient's wife, comfort measures only palliative and hospice consult appreciated, --- Wife is requesting holding off on transfer to  residential hospice as patient is more awake and more interactive today, taking oral intake much better----discussed with nephrologist Dr. Jonnie Finner who agrees with starting IV fluids and seeing if patient improves rather than transferring to residential hospice at this time  ?aspiration pneumonitis secondary vomiting 1/6 aspiration ---cardiomegaly and effusions on chest x-ray Is +4 L Initially given clindamycin 1/6 but because of no further episodes no fever curve and no other symptoms or findings, suspect just pneumonitis and would not treat beyond this--- comfort measures only palliative and hospice consult appreciated,    Fall with low back pain -Pain control appears adequate- comfort measures only palliative and hospice consult appreciated, awaiting transfer to residential hospice house  FEN--- able to take some oral intake--- Wife is requesting holding off on transfer to residential hospice as patient is more awake and more interactive today, taking oral intake much better----discussed with nephrologist Dr. Jonnie Finner who agrees with starting IV fluids and seeing if patient improves rather than transferring to residential hospice at this time   Parkinson's disease with dementia comfort measures only palliative and hospice consult appreciated, awaiting transfer to residential hospice house   Malnutrition BMI 18--- comfort measures only palliative and hospice consult appreciated,    Disposition--- overall prognosis is very poor,  Wife is requesting holding off on transfer to residential hospice as patient is more awake and more interactive today, taking oral intake much better----discussed with nephrologist Dr. Jonnie Finner who agrees with starting IV fluids and seeing if patient improves rather than transferring to residential hospice at this time   Roxan Hockey, MD  [facility] --Via [employer] app OR  --[email or web address]; password [email or web address]  7PM-7AM contact night coverage as above 05/29/2018,  2:24 PM  LOS: 7 days   Consultants:  Nephrology/Palliative care consult/hospice consult  Procedures:  None  Antimicrobials: No  Interval  history/Subjective: More awake and more verbal today than in the last several days wife at bedside, questions answered  Wife is requesting holding off on transfer to residential hospice as patient is more awake and more interactive today, taking oral intake much better----discussed with nephrologist Dr. Jonnie Finner who agrees with starting IV fluids and seeing if patient improves rather than transferring to residential hospice at this time  Objective:  Vitals:  Vitals:   05/29/18 0430 05/29/18 1344  BP: (!) 168/91 (!) 156/88  Pulse: 96 91  Resp: 18 18  Temp: 98.7 F (37.1 C) 98.5 F (36.9 C)  SpO2: 91% 91%    Exam:   Physical Exam  Patient is examined daily including today on 05/29/18 , exams remain the same as of yesterday except that has changed   Gen:-More awake and more verbal today than in the last several days , appears comfortable HEENT:- [place].AT, No sclera icterus Neck-Supple Neck,No JVD,.  Lungs-diminished in bases, no wheezing CV- S1, S2 normal Abd-  +ve B.Sounds, Abd Soft, No tenderness,    Extremity/Skin:-Warm and dry  psych-affect is flat, able to answer simple questions  neuro-generalized weakness with no new focal deficits, at baseline patient has parkinsonian tremors GU--Foley with clear urine  Scheduled Meds: . [START ON 05/30/2018] risperiDONE  0.5 mg Oral QHS   Continuous Infusions: . 0.45 % NaCl with KCl 20 mEq / L    . chlorproMAZINE (THORAZINE) IV      Active Problems:   Encephalopathy acute   DM type 2 (diabetes mellitus, type 2) (HCC)   UTI (urinary tract infection)   Dementia due to Parkinson's disease with behavioral disturbance (HCC)   AKI (acute kidney injury) ([place])   Dehydration   Malnutrition ([place])   Acute renal failure ([place])   Palliative care by specialist   Goals of care,  counseling/discussion   LOS: 7 days

## 2018-05-30 LAB — COMPREHENSIVE METABOLIC PANEL
ALT: 25 U/L (ref 0–44)
AST: 24 U/L (ref 15–41)
Albumin: 3 g/dL — ABNORMAL LOW (ref 3.5–5.0)
Alkaline Phosphatase: 85 U/L (ref 38–126)
Anion gap: 9 (ref 5–15)
BUN: 57 mg/dL — ABNORMAL HIGH (ref 8–23)
CO2: 18 mmol/L — ABNORMAL LOW (ref 22–32)
Calcium: 7.8 mg/dL — ABNORMAL LOW (ref 8.9–10.3)
Chloride: 113 mmol/L — ABNORMAL HIGH (ref 98–111)
Creatinine, Ser: 3.98 mg/dL — ABNORMAL HIGH (ref 0.61–1.24)
GFR calc non Af Amer: 14 mL/min — ABNORMAL LOW (ref >60)
GFR, EST AFRICAN AMERICAN: 16 mL/min — AB (ref >60)
Glucose, Bld: 201 mg/dL — ABNORMAL HIGH (ref 70–99)
Potassium: 4.3 mmol/L (ref 3.5–5.1)
Sodium: 140 mmol/L (ref 135–145)
Total Bilirubin: 1 mg/dL (ref 0.3–1.2)
Total Protein: 5.9 g/dL — ABNORMAL LOW (ref 6.5–8.1)

## 2018-05-30 LAB — GLUCOSE, CAPILLARY
Glucose-Capillary: 153 mg/dL — ABNORMAL HIGH (ref 70–99)
Glucose-Capillary: 155 mg/dL — ABNORMAL HIGH (ref 70–99)

## 2018-05-30 MED ORDER — HYDRALAZINE HCL 20 MG/ML IJ SOLN: 10.0000 mg | Freq: Four times a day (QID) | INTRAMUSCULAR | Status: DC | PRN

## 2018-05-30 NOTE — Care Management Important Message (Signed)
Important Message  Patient Details  Name: TRACE WIRICK MRN: [id number] Date of Birth: [birth year]   Medicare Important Message Given:  Yes    Kerin Salen 05/30/2018, 12:11 [place] Message  Patient Details  Name: BABE CLENNEY MRN: [id number] Date of Birth: Jul 05, 1940   Medicare Important Message Given:  Yes    Kerin Salen 05/30/2018, 12:11 PM

## 2018-05-30 NOTE — Progress Notes (Signed)
[facility] HOSPITALIST PROGRESS NOTE  Stephen Frost XBJ:[id number] DOB: Mar 16, 1941 DOA: 05/20/2018 PCP: Donita Brooks, MD   Narrative:  78 year old male At baseline --- limited with frequent falls Parkinson's disease Severe presbycusis Dementia Diabetes mellitus without complication -Found on floor down 3 PM but might of been down prior to that Has been having significant back pain unclear how long he has been down  Emergency room work-up suggestive of UTI-x-rays of the back negative for injury, x-rays also of leg neck [-] Also found to have acute kidney injury with creatinine in the 4 range as baseline is usually below 1 (creatinine was 1.1 on November 01, 2016 according to care everywhere records)  NB!!! Family requested comfort measures only palliative and hospice consult appreciated----however she is 05/28/2018 patient has been slightly more coherent, taking more oral intake and now creatinine and sodium appears to be improving----if patient improves further cognitively and from a lab standpoint he may be more appropriate for SNF rehab however if further decline may revisit residential hospice--per nephrologist is not a candidate for hemodialysis   A & Plan Acute metabolic encephalopathy multifactorial 2/2 uremia Versus Polypharmacy with opiates---  suspect uremia as a primary culprit- comfort measures only palliative and hospice consult appreciated, please see notation above regarding improvement since 05/28/2018  Acute kidney injury- on CKD III--- creatinine was 1.1 on November 01, 2016 according to care everywhere records, patient apparently developed CKD stage III in 2018, this admission creatinine was 4.7 on admission, creatinine is now down to 3.9 from 4.6, Bun is down to 57 from 67, sodium is down to 140 from 147,---patient remains uremic  PTA metformin, losartan Ditropan discontinued  FeNa suggestive intrinsic disease ATN/AIN-nephrology input appreciated,  electrophoresis and other labs  C3-C4 complement is negative PSA normal ---Brief w/u was negative for ANCA/ myeloma. Suspect pt is esrd at this point and uremic w/ somnolence.  Not a candidate for dialysis or immunosuppression given underlying comorbidities--- palliative/hospice options discussed with patient's wife, comfort measures only palliative and hospice consult appreciated, --- Wife is requesting holding off on transfer to residential hospice as patient is more awake and more interactive  taking oral intake  better----discussed with nephrologist Dr. Jonnie Finner who agrees with c/n IV fluids and seeing if patient improves rather than transferring to residential hospice at this time--please see notation above  Aspiration pneumonitis secondary vomiting 1/6 aspiration ---cardiomegaly and effusions on chest x-ray --- comfort measures only palliative and hospice consult appreciated-----please see notation above   Fall with low back pain -Pain control appears adequate- comfort measures only palliative and hospice consult appreciated, --- please see notation above  FEN--- able to take some oral intake--- Wife is requesting holding off on transfer to residential hospice as patient is more awake and more interactivetaking oral intake better----discussed with nephrologist Dr. Jonnie Finner who agrees , c/n IV fluids and seeing if patient improves rather than transferring to residential hospice at this time   Parkinson's disease with dementia comfort measures only palliative and hospice consult appreciated,   Malnutrition BMI 18--- comfort measures only palliative and hospice consult appreciated,    Disposition--- overall prognosis is very poor,  Wife is requesting holding off on transfer to residential hospice as patient is more awake and more interactive , taking oral intake   better----discussed with nephrologist Dr. Jonnie Finner who agrees with  IV fluids and seeing if patient improves rather than transferring to residential hospice at this  time   Roxan Hockey, MD  [facility] --Via [employer] app OR  --  [email or web address]; password [email or web address]  7PM-7AM contact night coverage as above 05/30/2018, 3:59 PM  LOS: 8 days   Consultants:  Nephrology/Palliative care consult/hospice consult  Procedures:  None  Antimicrobials: No  Interval history/Subjective: More awake and more verbal today than in the last several days wife at bedside, questions answered  Not as awake as 05/29/2018 but overall conversational at times  Objective:  Vitals:  Vitals:   05/30/18 1305 05/30/18 1309  BP:  (!) 176/96  Pulse:  96  Resp:  (!) 24  Temp:  98.4 F (36.9 C)  SpO2: (!) 86% 90%    Exam:   Physical Exam  Patient is examined daily including today on 05/30/18 , exams remain the same as of yesterday except that has changed   Gen:-More awake and more verbal   , appears comfortable HEENT:- [place].AT, No sclera icterus Neck-Supple Neck,No JVD,.  Lungs-diminished in bases, no wheezing CV- S1, S2 normal Abd-  +ve B.Sounds, Abd Soft, No tenderness,    Extremity/Skin:-Warm and dry  psych-affect is flat, able to answer simple questions  neuro-generalized weakness with no new focal deficits, at baseline patient has parkinsonian tremors GU--Foley with clear urine  Scheduled Meds: . risperiDONE  0.5 mg Oral QHS   Continuous Infusions: . 0.45 % NaCl with KCl 20 mEq / L 125 mL/hr at 05/30/18 1529  . chlorproMAZINE (THORAZINE) IV      Active Problems:   Encephalopathy acute   DM type 2 (diabetes mellitus, type 2) (HCC)   UTI (urinary tract infection)   Dementia due to Parkinson's disease with behavioral disturbance (HCC)   AKI (acute kidney injury) ([place])   Dehydration   Malnutrition ([place])   Acute renal failure ([place])   Palliative care by specialist   Goals of care, counseling/discussion   LOS: 8 days

## 2018-05-30 NOTE — Progress Notes (Signed)
PMT progress note  Patient is awake, resting in bed. Daughter Stephen Frost [phone] from [place], [place] is at the bedside.   We reviewed the patient's current condition and his underling illnesses.   78 yo gentleman with parkinson's disease, dementia, DM. He has been having falls at home, he has been admitted with acute metabolic encephalopathy AKI on chronic disease, aspiration pneumonitis.   BP (!) 165/96 (BP Location: Right Arm)   Pulse 98   Temp 97.9 F (36.6 C) (Oral)   Resp 20   Ht 5\' 6"  (1.676 m)   Wt 57.4 kg   SpO2 91%   BMI 20.42 kg/m  Labs and imaging noted  CMP ordered this am, results pending.   Awake, reportedly not as alert as yesterday, patient rallied over the weekend, was noted to be more alert, eating.  Regular S 1 S 2  Abdomen is not distended No edema Weak appearing gentleman  PPS 30%   Discussed with daughter, and also discussed with [facility] MD.   Monitor disease trajectory, patient to go to residential hospice if declining renal function, declining oral intake and mental status. Other wise, will need PT consult and consideration for SNF rehab with palliative care.   25 minutes spent [facility] palliative medicine team [phone]

## 2018-05-31 LAB — RENAL FUNCTION PANEL
ALBUMIN: 3.2 g/dL — AB (ref 3.5–5.0)
Anion gap: 9 (ref 5–15)
BUN: 58 mg/dL — ABNORMAL HIGH (ref 8–23)
CO2: 17 mmol/L — ABNORMAL LOW (ref 22–32)
Calcium: 8.3 mg/dL — ABNORMAL LOW (ref 8.9–10.3)
Chloride: 112 mmol/L — ABNORMAL HIGH (ref 98–111)
Creatinine, Ser: 4 mg/dL — ABNORMAL HIGH (ref 0.61–1.24)
GFR calc Af Amer: 16 mL/min — ABNORMAL LOW (ref >60)
GFR calc non Af Amer: 14 mL/min — ABNORMAL LOW (ref >60)
Glucose, Bld: 147 mg/dL — ABNORMAL HIGH (ref 70–99)
Phosphorus: 4.6 mg/dL (ref 2.5–4.6)
Potassium: 5.1 mmol/L (ref 3.5–5.1)
SODIUM: 138 mmol/L (ref 135–145)

## 2018-05-31 NOTE — Progress Notes (Signed)
   NB!!! Family requested comfort measures only palliative and hospice consult appreciated--- patient is more sleepy, much less responsive, overall becoming more and more uremic again, despite IV fluids renal function has not improved any further , nephrologist patient is not a candidate for hemodialysis , anticipate less than 2 weeks of life expectancy --- family now comfortable with transfer to residential hospice house with comfort care only  Roxan Hockey, MD

## 2018-05-31 NOTE — Progress Notes (Signed)
PMT progress note  Patient is awake but sleepy, resting in bed. Wife is at the bedside.   Discussed patient's current condition and his underling illnesses as well as his increased sleepiness and decreased PO intake today.   78 yo gentleman with parkinson's disease, dementia, DM. He has been having falls at home, he has been admitted with acute metabolic encephalopathy AKI on chronic disease, aspiration pneumonitis.   BP (!) 178/101 (BP Location: Right Arm)   Pulse (!) 107   Temp 98 F (36.7 C) (Oral)   Resp (!) 24   Ht 5\' 6"  (1.676 m)   Wt 57.4 kg   SpO2 91%   BMI 20.42 kg/m  Labs and imaging noted  Awake, reportedly not as alert as yesterday, patient rallied over the weekend, was noted to be more alert, eating.  Regular S 1 S 2  Abdomen is not distended No edema Weak appearing gentleman  PPS 30%  Discussed with wife at bedside.   Continue to monitor disease trajectory, patient to go to residential hospice if continues to have declining renal function, oral intake and mental status. Other wise, will need PT consult and consideration for SNF rehab with palliative care.   25 minutes spent Greater than 50%  of this time was spent counseling and coordinating care related to the above assessment and plan.  Micheline Rough, MD [facility] Team [phone]

## 2018-05-31 NOTE — Progress Notes (Signed)
[facility] PROGRESS NOTE  KINAN SAFLEY UEA:[id number] DOB: 1941-01-28 DOA: 05/20/2018 PCP: Donita Brooks, MD   Narrative:  78 year old male At baseline --- limited with frequent falls Parkinson's disease Severe presbycusis Dementia Diabetes mellitus without complication -Found on floor down 3 PM but might of been down prior to that Has been having significant back pain unclear how long he has been down  Emergency room work-up suggestive of UTI-x-rays of the back negative for injury, x-rays also of leg neck [-] Also found to have acute kidney injury with creatinine in the 4 range as baseline is usually below 1 (creatinine was 1.1 on November 01, 2016 according to care everywhere records)  NB!!! Family requested comfort measures only palliative and hospice consult appreciated--- patient is more sleepy, much less responsive, overall becoming more and more uremic again, despite IV fluids renal function has not improved any further , nephrologist patient is not a candidate for hemodialysis , anticipate less than 2 weeks of life expectancy --- family now comfortable with transfer to residential hospice house with comfort care only  A & Plan Acute metabolic encephalopathy multifactorial 2/2 uremia Versus Polypharmacy with opiates---  suspect uremia as a primary culprit- comfort measures only palliative and hospice consult appreciated,    Acute kidney injury- on CKD III--- creatinine was 1.1 on November 01, 2016 according to care everywhere records, patient apparently developed CKD stage III in 2018, this admission creatinine was 4.7 on admission, creatinine is now down to 3.9 from 4.6, Bun is down to 57 from 67, sodium is down to 140 from 147,---patient remains uremic  PTA metformin, losartan Ditropan discontinued  FeNa suggestive intrinsic disease ATN/AIN-nephrology input appreciated,  electrophoresis and other labs C3-C4 complement is negative PSA normal ---Brief w/u was negative for ANCA/  myeloma. Suspect pt is esrd at this point and uremic w/ somnolence.  Not a candidate for dialysis or immunosuppression given underlying comorbidities--- palliative/hospice options discussed with patient's wife, comfort measures only palliative and hospice consult appreciated, ---  patient is more sleepy, much less responsive, overall becoming more and more uremic again, despite IV fluids renal function has not improved any further , nephrologist patient is not a candidate for hemodialysis , anticipate less than 2 weeks of life expectancy --- family now comfortable with transfer to residential hospice house with comfort care only  Aspiration pneumonitis secondary vomiting 1/6 aspiration ---cardiomegaly and effusions on chest x-ray --- comfort measures only palliative and hospice consult appreciated-----please see notation above   Fall with low back pain -Pain control appears adequate- comfort measures only palliative and hospice consult appreciated, --- please see notation above  FEN--- able to take some oral intake---  patient is more sleepy, much less responsive, overall becoming more and more uremic again, despite IV fluids renal function has not improved any further , nephrologist patient is not a candidate for hemodialysis , anticipate less than 2 weeks of life expectancy --- family now comfortable with transfer to residential hospice house with comfort care only  Parkinson's disease with dementia comfort measures only palliative and hospice consult appreciated,   Malnutrition BMI 18--- comfort measures only palliative and hospice consult appreciated,    Disposition--- overall prognosis is very poor/grave,   patient is more sleepy, much less responsive, overall becoming more and more uremic again, despite IV fluids renal function has not improved any further , nephrologist patient is not a candidate for hemodialysis , anticipate less than 2 weeks of life expectancy --- family now comfortable  with transfer  to residential hospice house with comfort care only  Roxan Hockey, MD  [facility] --[place] [place]  --[email or web address]; password [email or web address]  7PM-7AM contact night coverage as above 05/31/2018, 12:55 PM  LOS: 9 days   Consultants:  Nephrology/Palliative care consult/hospice consult  Procedures:  None  Antimicrobials: No  Interval history/Subjective: More sleepy, less responsive, wife at bedside, questions answered  Not as awake as 05/30/2018 ----- patient is more sleepy, much less responsive, overall becoming more and more uremic again, despite IV fluids renal function has not improved any further , nephrologist patient is not a candidate for hemodialysis , anticipate less than 2 weeks of life expectancy --- family now comfortable with transfer to residential hospice house with comfort care only  Objective:  Vitals:  Vitals:   05/30/18 2048 05/31/18 0418  BP: (!) 156/91 (!) 157/93  Pulse: 97 91  Resp: 12   Temp: (!) 97.3 F (36.3 C) 98 F (36.7 C)  SpO2: 90% 94%    Exam:  Physical Exam  Patient is examined daily including today on 05/31/18 , exams remain the same as of yesterday except that has changed   Gen:-patient is more sleepy, much less responsive, overall becoming more and more uremic again--- family now comfortable with transfer to residential hospice house with comfort care only HEENT:- [place].AT, No sclera icterus Neck-Supple Neck,No JVD,.  Lungs-diminished in bases, no wheezing CV- S1, S2 normal Abd-  +ve B.Sounds, Abd Soft, No tenderness,    Extremity/Skin:-Warm and dry  psych-affect is lethargic/sleepy,  neuro- Generalized weakness with no new focal deficits, at baseline patient has parkinsonian tremors GU--Foley with clear urine  Scheduled Meds: . risperiDONE  0.5 mg Oral QHS   Continuous Infusions: . chlorproMAZINE (THORAZINE) IV      patient is more sleepy, much less responsive, overall becoming more and more uremic again,  despite IV fluids renal function has not improved any further , nephrologist patient is not a candidate for hemodialysis , anticipate less than 2 weeks of life expectancy --- family now comfortable with transfer to residential hospice house with comfort care only   Active Problems:   Encephalopathy acute   DM type 2 (diabetes mellitus, type 2) ([place])   UTI (urinary tract infection)   Dementia due to Parkinson's disease with behavioral disturbance ([facility])   AKI (acute kidney injury) ([place])   Dehydration   Malnutrition ([place])   Acute renal failure ([place])   Palliative care by specialist   Goals of care, counseling/discussion   LOS: 9 days

## 2018-05-31 NOTE — Progress Notes (Signed)
Clinical Social Worker following patient and family for support and discharge needs. CSW received verbal consult from MD stating that patient will need to transition to a residential hospice facility and family are on board. CSW contacted [place] to make her aware of MD's consult. Harmon Pier spoke with MD via phone in regards to patients care. Harmon Pier stated she will need updated note stating patients level of need. CSW will continue to follow for residential hospice placement.   Rhea Pink, MSW,  [place]

## 2018-05-31 NOTE — Progress Notes (Signed)
OT Cancellation Note  Patient Details Name: Stephen Frost MRN: [id number] DOB: 04-Jun-1940   Cancelled Treatment:    Reason Eval/Treat Not Completed: Other (comment)  Noted plan for pt to transfer to hospice- will sign off  Kari Baars, [place] [phone] Office- [phone], Edwena Felty D 05/31/2018, 2:09 PM

## 2018-05-31 NOTE — Progress Notes (Signed)
Nutrition Brief Note  Patient last seen by a RD on 05/26/2018. Chart reviewed. Pt now transitioning to comfort care with plans to d/c to residential hospice facility (likely [facility]). No further nutrition interventions warranted at this time.  Please re-consult as needed.     Jarome Matin, MS, RD, LDN, [facility] Inpatient Clinical Dietitian Pager # [phone] After hours/weekend pager # [phone]

## 2018-06-01 DIAGNOSIS — G20 Parkinson's disease: Secondary | ICD-10-CM

## 2018-06-01 DIAGNOSIS — G20A1 Parkinson's disease without dyskinesia, without mention of fluctuations: Secondary | ICD-10-CM

## 2018-06-01 MED ORDER — ACETAMINOPHEN 325 MG PO TABS: 650.0000 mg | ORAL_TABLET | Freq: Four times a day (QID) | ORAL | Status: AC | PRN

## 2018-06-01 MED ORDER — ACETAMINOPHEN 650 MG RE SUPP: 650.0000 mg | Freq: Four times a day (QID) | RECTAL | 0 refills | Status: AC | PRN

## 2018-06-01 NOTE — Discharge Summary (Signed)
Physician Discharge Summary  Stephen Frost UJW:[id number] DOB: Sep 02, 1940 DOA: 05/20/2018  PCP: Donita Brooks, MD  Admit date: 05/20/2018 Discharge date: 06/01/2018  Admitted From home hospice Disposition hospice  Home Health: None Equipment/Devices none Discharge Condition: Hospice discharge CODE STATUS: DO NOT RESUSCITATE Diet recommendation regular diet Brief/Interim Summary:78 year old male At baseline --- limited with frequent falls Parkinson's disease Severe presbycusis Dementia Diabetes mellitus without complication -Found on floor down 3 PM but might of been down prior to that Has been having significant back pain unclear how long he has been down  Emergency room work-up suggestive of UTI-x-rays of the back negative for injury, x-rays also of leg neck [-] Also found to have acute kidney injury with creatinine in the 4 range as baseline is usually below 1 (creatinine was 1.1 on November 01, 2016 according to care everywhere records)  NB!!! Family requested comfort measures only palliative and hospice consult appreciated--- patient is more sleepy, much less responsive, overall becoming more and more uremic again, despite IV fluids renal function has not improved any further , nephrologist patient is not a candidate for hemodialysis , anticipate less than 2 weeks of life expectancy --- family now comfortable with transfer to residential hospice house with comfort care only   Discharge Diagnoses:  Active Problems:   Encephalopathy acute   DM type 2 (diabetes mellitus, type 2) ([place])   UTI (urinary tract infection)   Dementia due to Parkinson's disease with behavioral disturbance (HCC)   AKI (acute kidney injury) ([place])   Dehydration   Malnutrition ([place])   Acute renal failure ([place])   Palliative care by specialist   Goals of care, counseling/discussion  Acute metabolic encephalopathy multifactorial 2/2 uremia Versus Polypharmacy with opiates---  suspect uremia as a  primary culprit- comfort measures only palliative and hospice consult appreciated,    Acute kidney injury- on CKD III--- creatinine was 1.1 on November 01, 2016 according to care everywhere records, patient apparently developed CKD stage III in 2018, this admission creatinine was 4.7 on admission, creatinine is now down to 3.9 from 4.6, Bun is down to 57 from 67, sodium is down to 140 from 147,---patient remains uremic  Patient to be admitted to [facility] today family has chosen full comfort care and hospice discharge.  Aspiration pneumonitis secondary vomiting 1/6 aspiration ---cardiomegaly and effusions on chest x-ray  Nutrition Problem: Inadequate oral intake Etiology: dysphagia    Signs/Symptoms: per patient/family report     Interventions: Magic cup, Hormel Shake  Estimated body mass index is 20.42 kg/m as calculated from the following:   Height as of this encounter: 5\' 6"  (1.676 m).   Weight as of this encounter: 57.4 kg.  Discharge Instructions  Discharge Instructions    Diet - low sodium heart healthy   Complete by:  As directed    Increase activity slowly   Complete by:  As directed      Allergies as of 06/01/2018      Reactions   Insulin Lispro Other (See Comments)   Metformin And Related Diarrhea      Medication List    STOP taking these medications   atorvastatin 40 MG tablet Commonly known as:  LIPITOR   carbidopa-levodopa 10-100 MG tablet Commonly known as:  SINEMET IR   gabapentin 800 MG tablet Commonly known as:  NEURONTIN   glucose 4 GM chewable tablet   insulin glargine 100 UNIT/ML injection Commonly known as:  LANTUS   latanoprost 0.005 % ophthalmic solution Commonly known as:  XALATAN   losartan 50 MG tablet Commonly known as:  COZAAR   metformin 500 MG (OSM) 24 hr tablet Commonly known as:  FORTAMET   omeprazole 20 MG capsule Commonly known as:  PRILOSEC   oxybutynin 5 MG tablet Commonly known as:  DITROPAN   SEMAGLUTIDE (1  MG/DOSE) [place]   SIMBRINZA 1-0.2 % Susp Generic drug:  Brinzolamide-Brimonidine   timolol 0.5 % ophthalmic solution Commonly known as:  TIMOPTIC     TAKE these medications   acetaminophen 325 MG tablet Commonly known as:  TYLENOL Take 2 tablets (650 mg total) by mouth every 6 (six) hours as needed for mild pain (or Fever >/= 101).   acetaminophen 650 MG suppository Commonly known as:  TYLENOL Place 1 suppository (650 mg total) rectally every 6 (six) hours as needed for mild pain (or Fever >/= 101).      Contact information for after-discharge care    Destination    [facility] .   Service:  Skilled Nursing Contact information: [place] [phone]             Allergies  Allergen Reactions  . Insulin Lispro Other (See Comments)  . Metformin And Related Diarrhea    Consultations: Nephrology hospice and palliative care   Procedures/Studies: Dg Chest 2 View  Result Date: 05/24/2018 CLINICAL DATA:  Aspiration. EXAM: CHEST - 2 VIEW COMPARISON:  Radiograph May 23, 2018. FINDINGS: Stable cardiomediastinal silhouette. Central pulmonary vascular congestion is noted with possible mild pulmonary edema. No pneumothorax is noted. Small pleural effusions are noted. Bony thorax is unremarkable. IMPRESSION: Central pulmonary vascular congestion is noted with possible bilateral pulmonary edema. Small pleural effusions are noted. Electronically Signed   By: Marijo Conception, M.D.   On: 05/24/2018 08:59   Dg Thoracic Spine 2 View  Result Date: 05/20/2018 CLINICAL DATA:  Weakness EXAM: THORACIC SPINE 2 VIEWS COMPARISON:  Chest x-ray 05/09/2014 FINDINGS: Minimal rightward curvature of the midthoracic spine. Vertebral body heights are maintained. Degenerative osteophytes of the spine. IMPRESSION: No acute osseous abnormality. Electronically Signed   By: Donavan Foil M.D.   On: 05/20/2018 19:13   Dg Lumbar Spine  2-3 Views  Result Date: 05/20/2018 CLINICAL DATA:  Weakness EXAM: LUMBAR SPINE - 2-3 VIEW COMPARISON:  None. FINDINGS: Vertebral body heights are maintained. Mild degenerative changes at L5-S1. IMPRESSION: Mild degenerative changes.  No acute osseous abnormality. Electronically Signed   By: Donavan Foil M.D.   On: 05/20/2018 19:14   Ct Head Wo Contrast  Result Date: 05/20/2018 CLINICAL DATA:  Dementia patient post unwitnessed fall. Head trauma, minor, GCS>=13, high clinical risk, initial exam; C-spine trauma, high clinical risk (NEXUS/CCR) EXAM: CT HEAD WITHOUT CONTRAST CT CERVICAL SPINE WITHOUT CONTRAST TECHNIQUE: Multidetector CT imaging of the head and cervical spine was performed following the standard protocol without intravenous contrast. Multiplanar CT image reconstructions of the cervical spine were also generated. COMPARISON:  Head and cervical spine CT 11/15/2013 FINDINGS: CT HEAD FINDINGS Brain: Unchanged atrophy and chronic small vessel ischemia. No intracranial hemorrhage, mass effect, or midline shift. No hydrocephalus. The basilar cisterns are patent. No evidence of territorial infarct or acute ischemia. No extra-axial or intracranial fluid collection. Vascular: Atherosclerosis of skullbase vasculature without hyperdense vessel or abnormal calcification. Skull: No fracture or focal lesion. Sinuses/Orbits: No acute findings. Minimal chronic debris in left side of sphenoid sinus. Bilateral cataract resection. Other: None. CT CERVICAL SPINE FINDINGS Alignment: Slight exaggerated cervical lordosis, no traumatic subluxation.  Skull base and vertebrae: No acute fracture. The dens and skull base are intact. Heterogeneous marrow appearance is unchanged from prior exam and likely combination of osteopenia and degenerative change. Stable subcentimeter benign lucency within posterior C6 vertebral body. Soft tissues and spinal canal: No prevertebral fluid or swelling. No visible canal hematoma. Disc levels:  Stable multilevel degenerative disc disease and facet arthropathy. Upper chest: Negative. Other: None. IMPRESSION: 1. No acute intracranial abnormality. No skull fracture. Stable atrophy and chronic small vessel ischemia. 2. Stable degenerative change in the cervical spine without acute fracture or subluxation. Electronically Signed   By: Keith Rake M.D.   On: 05/20/2018 18:55   Ct Cervical Spine Wo Contrast  Result Date: 05/20/2018 CLINICAL DATA:  Dementia patient post unwitnessed fall. Head trauma, minor, GCS>=13, high clinical risk, initial exam; C-spine trauma, high clinical risk (NEXUS/CCR) EXAM: CT HEAD WITHOUT CONTRAST CT CERVICAL SPINE WITHOUT CONTRAST TECHNIQUE: Multidetector CT imaging of the head and cervical spine was performed following the standard protocol without intravenous contrast. Multiplanar CT image reconstructions of the cervical spine were also generated. COMPARISON:  Head and cervical spine CT 11/15/2013 FINDINGS: CT HEAD FINDINGS Brain: Unchanged atrophy and chronic small vessel ischemia. No intracranial hemorrhage, mass effect, or midline shift. No hydrocephalus. The basilar cisterns are patent. No evidence of territorial infarct or acute ischemia. No extra-axial or intracranial fluid collection. Vascular: Atherosclerosis of skullbase vasculature without hyperdense vessel or abnormal calcification. Skull: No fracture or focal lesion. Sinuses/Orbits: No acute findings. Minimal chronic debris in left side of sphenoid sinus. Bilateral cataract resection. Other: None. CT CERVICAL SPINE FINDINGS Alignment: Slight exaggerated cervical lordosis, no traumatic subluxation. Skull base and vertebrae: No acute fracture. The dens and skull base are intact. Heterogeneous marrow appearance is unchanged from prior exam and likely combination of osteopenia and degenerative change. Stable subcentimeter benign lucency within posterior C6 vertebral body. Soft tissues and spinal canal: No prevertebral  fluid or swelling. No visible canal hematoma. Disc levels: Stable multilevel degenerative disc disease and facet arthropathy. Upper chest: Negative. Other: None. IMPRESSION: 1. No acute intracranial abnormality. No skull fracture. Stable atrophy and chronic small vessel ischemia. 2. Stable degenerative change in the cervical spine without acute fracture or subluxation. Electronically Signed   By: Keith Rake M.D.   On: 05/20/2018 18:55   US Renal  Result Date: 05/21/2018 CLINICAL DATA:  Acute renal failure. EXAM: RENAL / URINARY TRACT ULTRASOUND COMPLETE COMPARISON:  04/25/2014 FINDINGS: Right Kidney: Renal measurements: 5.7 x 5.1 x 10.6 cm = volume: 160 mL. Mild increased cortical echogenicity. No mass or hydronephrosis visualized. Left Kidney: Renal measurements: 5.0 x 6.7 x 11.8 cm = volume: 206 mL. Mild increased cortical echogenicity. No mass or hydronephrosis visualized. Bladder: Appears normal for degree of bladder distention. Impression upon the bladder base with mild shadowing likely due to adjacent prominent prostate gland with possible calcification. IMPRESSION: Normal size kidneys without hydronephrosis. Mild increased cortical echogenicity which can be seen with medical renal disease. Mild prominence of the prostate gland with impression upon the bladder base. Electronically Signed   By: Marin Olp M.D.   On: 05/21/2018 08:23   Dg Pelvis Portable  Result Date: 05/20/2018 CLINICAL DATA:  Weakness EXAM: PORTABLE PELVIS 1-2 VIEWS COMPARISON:  None. FINDINGS: Vascular calcifications. Pubic symphysis and rami are intact. SI joints are non widened. No fracture or malalignment. Mild degenerative changes. IMPRESSION: No acute osseous abnormality Electronically Signed   By: Donavan Foil M.D.   On: 05/20/2018 19:12   Dg Chest  Port 1 View  Result Date: 05/23/2018 CLINICAL DATA:  Concern for aspiration EXAM: PORTABLE CHEST 1 VIEW COMPARISON:  05/20/2017; 05/09/2014 FINDINGS: Grossly unchanged  borderline enlarged cardiac silhouette and mediastinal contours. Mild pulmonary is congestion without frank evidence of edema. Worsening bibasilar heterogeneous opacities, left greater than right. No definite pleural effusion or pneumothorax. No definite acute osseous abnormalities. IMPRESSION: 1. Interval development of bibasilar heterogeneous opacities, left greater than right, potentially atelectasis though developing infection/aspiration could have a similar appearance. A follow-up chest radiograph in 3 to 4 weeks after treatment is recommended to ensure resolution. 2. Suspected pulmonary venous congestion without frank evidence of edema. Electronically Signed   By: Sandi Mariscal M.D.   On: 05/23/2018 18:00   Dg Chest Port 1 View  Result Date: 05/20/2018 CLINICAL DATA:  Fall EXAM: PORTABLE CHEST 1 VIEW COMPARISON:  05/09/2014 FINDINGS: No acute consolidation or effusion. Normal cardiomediastinal silhouette with aortic atherosclerosis. No pneumothorax. IMPRESSION: No active disease. Electronically Signed   By: Donavan Foil M.D.   On: 05/20/2018 19:11    (Echo, Carotid, EGD, Colonoscopy, ERCP)    Subjective: Patient in bed on oxygen opens eyes when called his name wife by the bedside appears comfortable  Discharge Exam: Vitals:   05/31/18 1303 05/31/18 2127  BP: (!) 155/90 (!) 178/101  Pulse: (!) 108 (!) 107  Resp: 16 (!) 24  Temp: 98.7 F (37.1 C) 98 F (36.7 C)  SpO2: 90% 91%   Vitals:   05/30/18 2048 05/31/18 0418 05/31/18 1303 05/31/18 2127  BP: (!) 156/91 (!) 157/93 (!) 155/90 (!) 178/101  Pulse: 97 91 (!) 108 (!) 107  Resp: 12  16 (!) 24  Temp: (!) 97.3 F (36.3 C) 98 F (36.7 C) 98.7 F (37.1 C) 98 F (36.7 C)  TempSrc: Oral Oral Axillary Oral  SpO2: 90% 94% 90% 91%  Weight:      Height:        General:not in acute distress Cardiovascular: RRR, S1/S2 +, no rubs, no gallops Respiratory: CTA bilaterally, no wheezing, no rhonchi Abdominal: Soft, NT, ND, bowel sounds  + Extremities: 1+ edema   The results of significant diagnostics from this hospitalization (including imaging, microbiology, ancillary and laboratory) are listed below for reference.     Microbiology: No results found for this or any previous visit (from the past 240 hour(s)).   Labs: BNP (last 3 results) No results for input(s): BNP in the last 8760 hours. Basic Metabolic Panel: Recent Labs  Lab 05/26/18 0603 05/27/18 0905 05/28/18 1530 05/30/18 1118 05/31/18 0808  NA 145 147* 147* 140 138  K 4.2 3.8 3.4* 4.3 5.1  CL 112* 116* 119* 113* 112*  CO2 15* 18* 19* 18* 17*  GLUCOSE 158* 170* 321* 201* 147*  BUN 55* 63* 67* 57* 58*  CREATININE 4.15* 4.62* 4.59* 3.98* 4.00*  CALCIUM 8.6* 8.8* 8.1* 7.8* 8.3*  PHOS  --   --   --   --  4.6   Liver Function Tests: Recent Labs  Lab 05/30/18 1118 05/31/18 0808  AST 24  --   ALT 25  --   ALKPHOS 85  --   BILITOT 1.0  --   PROT 5.9*  --   ALBUMIN 3.0* 3.2*   No results for input(s): LIPASE, AMYLASE in the last 168 hours. No results for input(s): AMMONIA in the last 168 hours. CBC: Recent Labs  Lab 05/27/18 0934 05/28/18 1530  WBC 12.0* 9.0  HGB 13.0 12.6*  HCT 41.7 40.6  MCV  94.8 92.3  PLT 240 242   Cardiac Enzymes: No results for input(s): CKTOTAL, CKMB, CKMBINDEX, TROPONINI in the last 168 hours. BNP: Invalid input(s): POCBNP CBG: Recent Labs  Lab 05/29/18 1715 05/29/18 1950 05/29/18 2355 05/30/18 0432 05/30/18 0800  GLUCAP 182* 179* 160* 153* 155*   D-Dimer No results for input(s): DDIMER in the last 72 hours. Hgb A1c No results for input(s): HGBA1C in the last 72 hours. Lipid Profile No results for input(s): CHOL, HDL, LDLCALC, TRIG, CHOLHDL, LDLDIRECT in the last 72 hours. Thyroid function studies No results for input(s): TSH, T4TOTAL, T3FREE, THYROIDAB in the last 72 hours.  Invalid input(s): FREET3 Anemia work up No results for input(s): VITAMINB12, FOLATE, FERRITIN, TIBC, IRON, RETICCTPCT in  the last 72 hours. Urinalysis    Component Value Date/Time   COLORURINE YELLOW 05/20/2018 2043   APPEARANCEUR CLEAR 05/20/2018 2043   LABSPEC 1.012 05/20/2018 2043   PHURINE 6.0 05/20/2018 2043   GLUCOSEU >=500 (A) 05/20/2018 2043   HGBUR LARGE (A) 05/20/2018 2043   BILIRUBINUR NEGATIVE 05/20/2018 2043   KETONESUR 20 (A) 05/20/2018 2043   PROTEINUR >=300 (A) 05/20/2018 2043   UROBILINOGEN 0.2 04/24/2014 0830   NITRITE NEGATIVE 05/20/2018 2043   LEUKOCYTESUR SMALL (A) 05/20/2018 2043   Sepsis Labs Invalid input(s): PROCALCITONIN,  WBC,  LACTICIDVEN Microbiology No results found for this or any previous visit (from the past 240 hour(s)).   Time coordinating discharge:  34 minutes  SIGNED:   Georgette Shell, MD  [facility] 06/01/2018, 9:57 AM Pager   If 7PM-7AM, please contact night-coverage [email or web address] Password [email or web address]

## 2018-06-01 NOTE — Progress Notes (Signed)
[facility] room available this morning for Mr. Julia. Paper work completed with spouse this am. Dr. Orpah Melter to assume care per family preference. CSW aware.   Please send discharge summary to [phone].  RN please call report to [phone].  Thank you,  Erling Conte, LCSW [phone]

## 2018-06-01 NOTE — Clinical Social Work Placement (Signed)
   CLINICAL SOCIAL WORK PLACEMENT  NOTE  Date:  06/01/2018  Patient Details  Name: Stephen Frost MRN: [id number] Date of Birth: Jun 22, 1940  Clinical Social Work is seeking post-discharge placement for this patient at the ([facility]) level of care (*CSW will initial, date and re-position this form in  chart as items are completed):  Yes   Patient/family provided with [facility] Work Department's list of facilities offering this level of care within the geographic area requested by the patient (or if unable, by the patient's family).  Yes   Patient/family informed of their freedom to choose among providers that offer the needed level of care, that participate in Medicare, Medicaid or managed care program needed by the patient, have an available bed and are willing to accept the patient.  Yes   Patient/family informed of [facility]'s ownership interest in [facility] and [facility], as well as of the fact that they are under no obligation to receive care at these facilities.  PASRR submitted to EDS on       PASRR number received on       Existing PASRR number confirmed on 06/01/18     FL2 transmitted to all facilities in geographic area requested by pt/family on       FL2 transmitted to all facilities within larger geographic area on       Patient informed that his/her managed care company has contracts with or will negotiate with certain facilities, including the following:            Patient/family informed of bed offers received.  [facility]     Physician recommends and patient chooses bed at      Patient to be transferred to ([facility]) on 06/01/18.  Patient to be transferred to facility by [facility]     Patient family notified on 06/01/18 of transfer.  Name of family member notified:  [facility] spoke with family to make them aware of discharge      PHYSICIAN Please sign DNR     Additional Comment:     _______________________________________________ Wende Neighbors, LCSW 06/01/2018, 9:56 AM

## 2018-06-01 NOTE — Plan of Care (Signed)
  Problem: Nutrition: Goal: Adequate nutrition will be maintained Outcome: Adequate for Discharge   Problem: Coping: Goal: Level of anxiety will decrease Outcome: Adequate for Discharge

## 2018-06-01 NOTE — Progress Notes (Signed)
Clinical Social Worker facilitated patient discharge including contacting patient family and facility to confirm patient discharge plans.  Clinical information faxed to facility and family agreeable with plan.  CSW arranged ambulance transport via [facility] to [facility].  RN to call [phone] for report prior to discharge.  Clinical Social Worker will sign off for now as social work intervention is no longer needed. Please consult [place] again if new need arises.  Rhea Pink, MSW, [place]

## 2018-06-01 NOTE — Progress Notes (Signed)
Pt and family at bedside given discharge instructions. No complaints at this time and all questions answered. Pt belonging returned. Pt stable at time of discharge. Pt left floor via [facility] transport to facility.

## 2018-06-18 DEATH — deceased

## 2020-03-07 IMAGING — DX DG THORACIC SPINE 2V
3 series · 3 of 3 positions shown · non-contrast
Comparison: Chest x-ray 05/09/2014

CLINICAL DATA: Weakness

EXAM:
THORACIC SPINE 2 VIEWS

[t-spine ap]
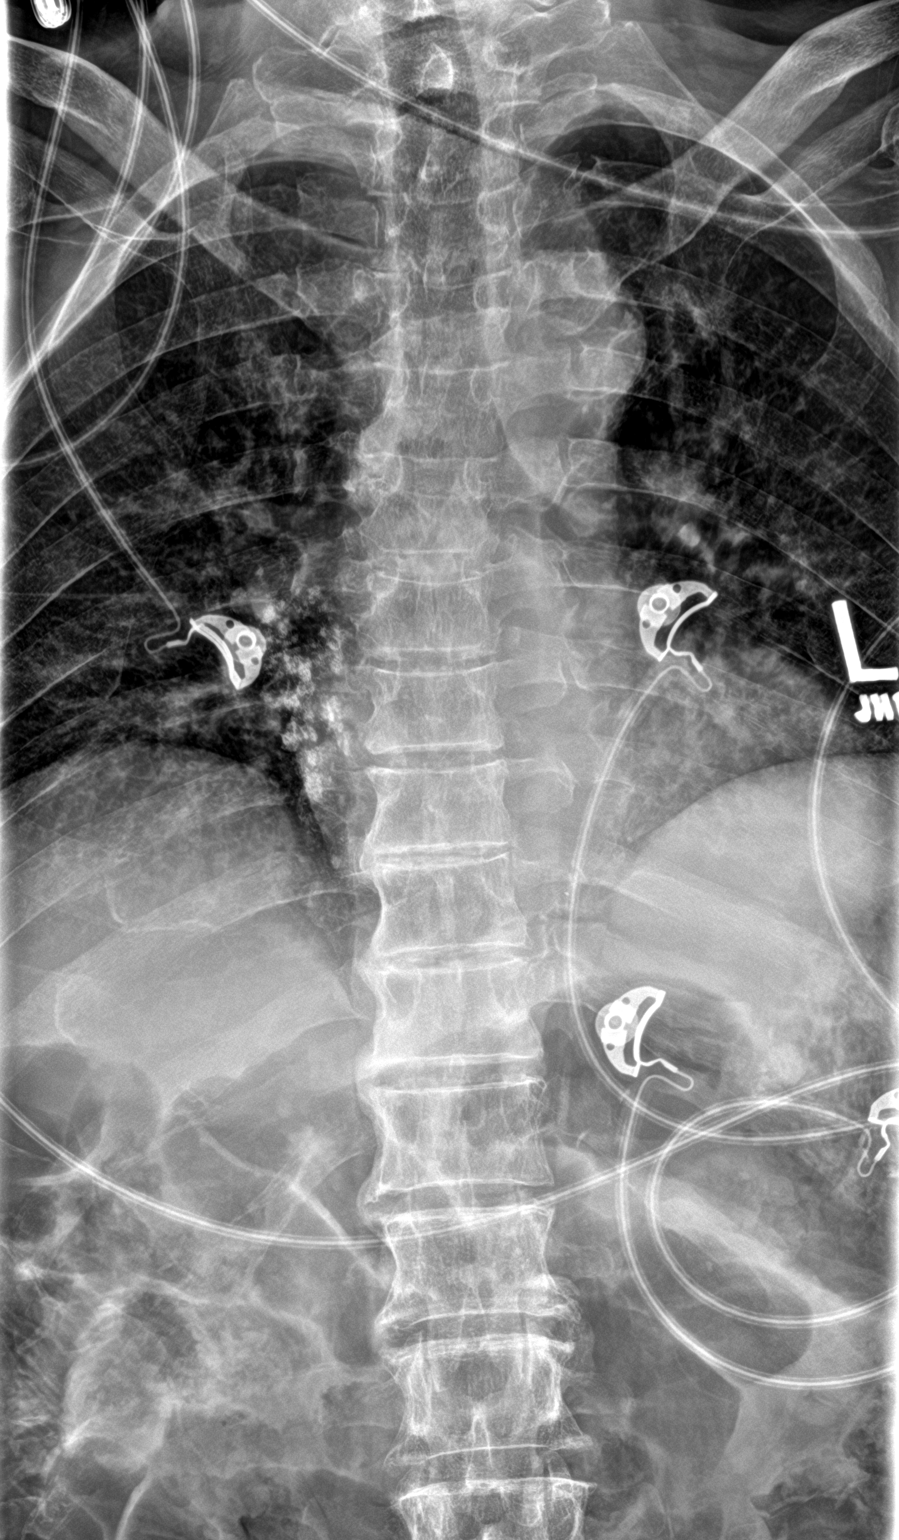

[t-spine lat]
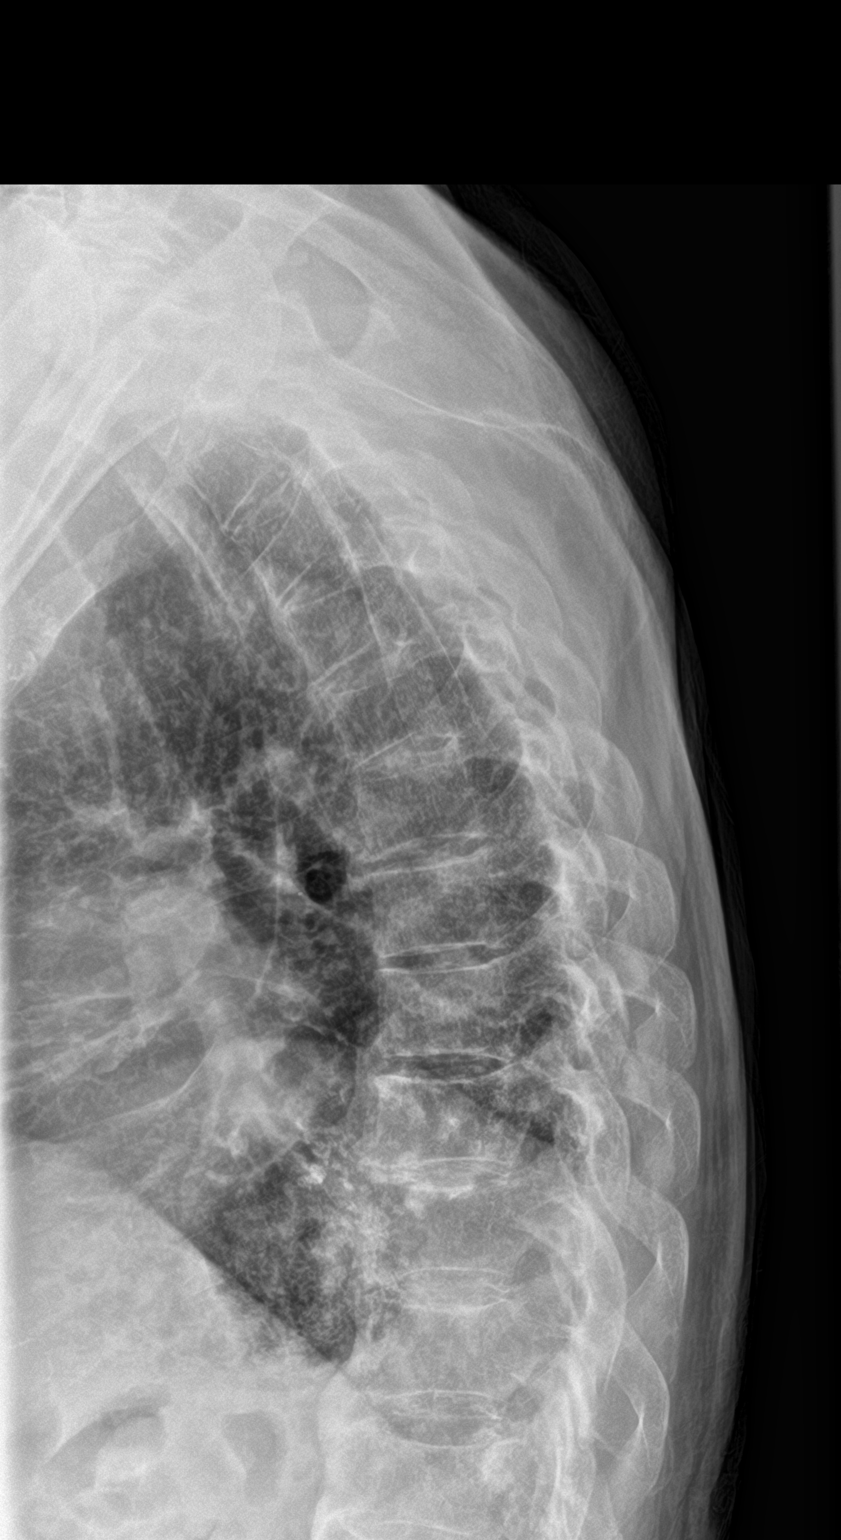

[t-spine swimmers]
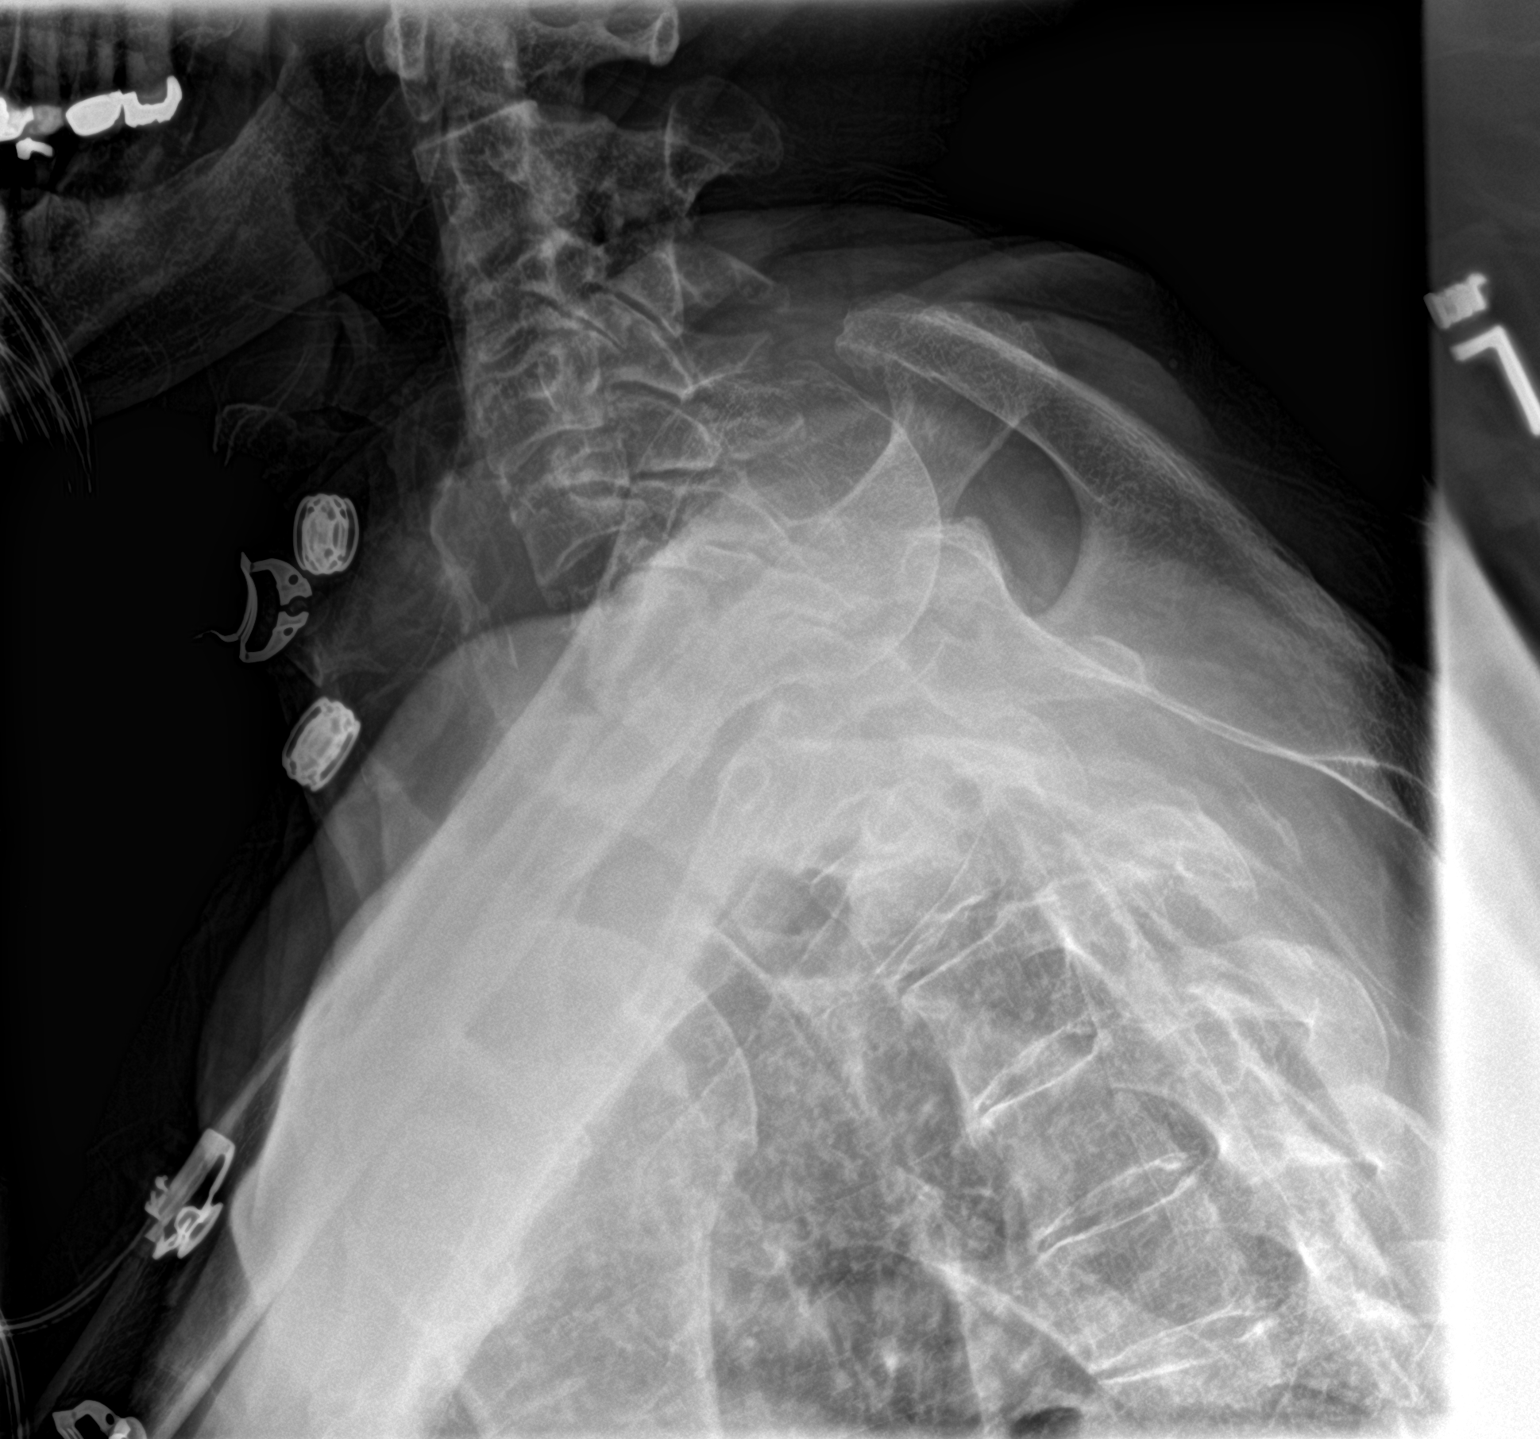

[3 of 3 positions shown; findings below may reference images not displayed]

FINDINGS: Minimal rightward curvature of the midthoracic spine. Vertebral body
heights are maintained. Degenerative osteophytes of the spine.
IMPRESSION: No acute osseous abnormality.

## 2020-03-07 IMAGING — DX DG PORTABLE PELVIS
1 series · 1 of 1 positions shown · non-contrast
Comparison: None.

CLINICAL DATA: Weakness

EXAM:
PORTABLE PELVIS 1-2 VIEWS

[pelvis ap]
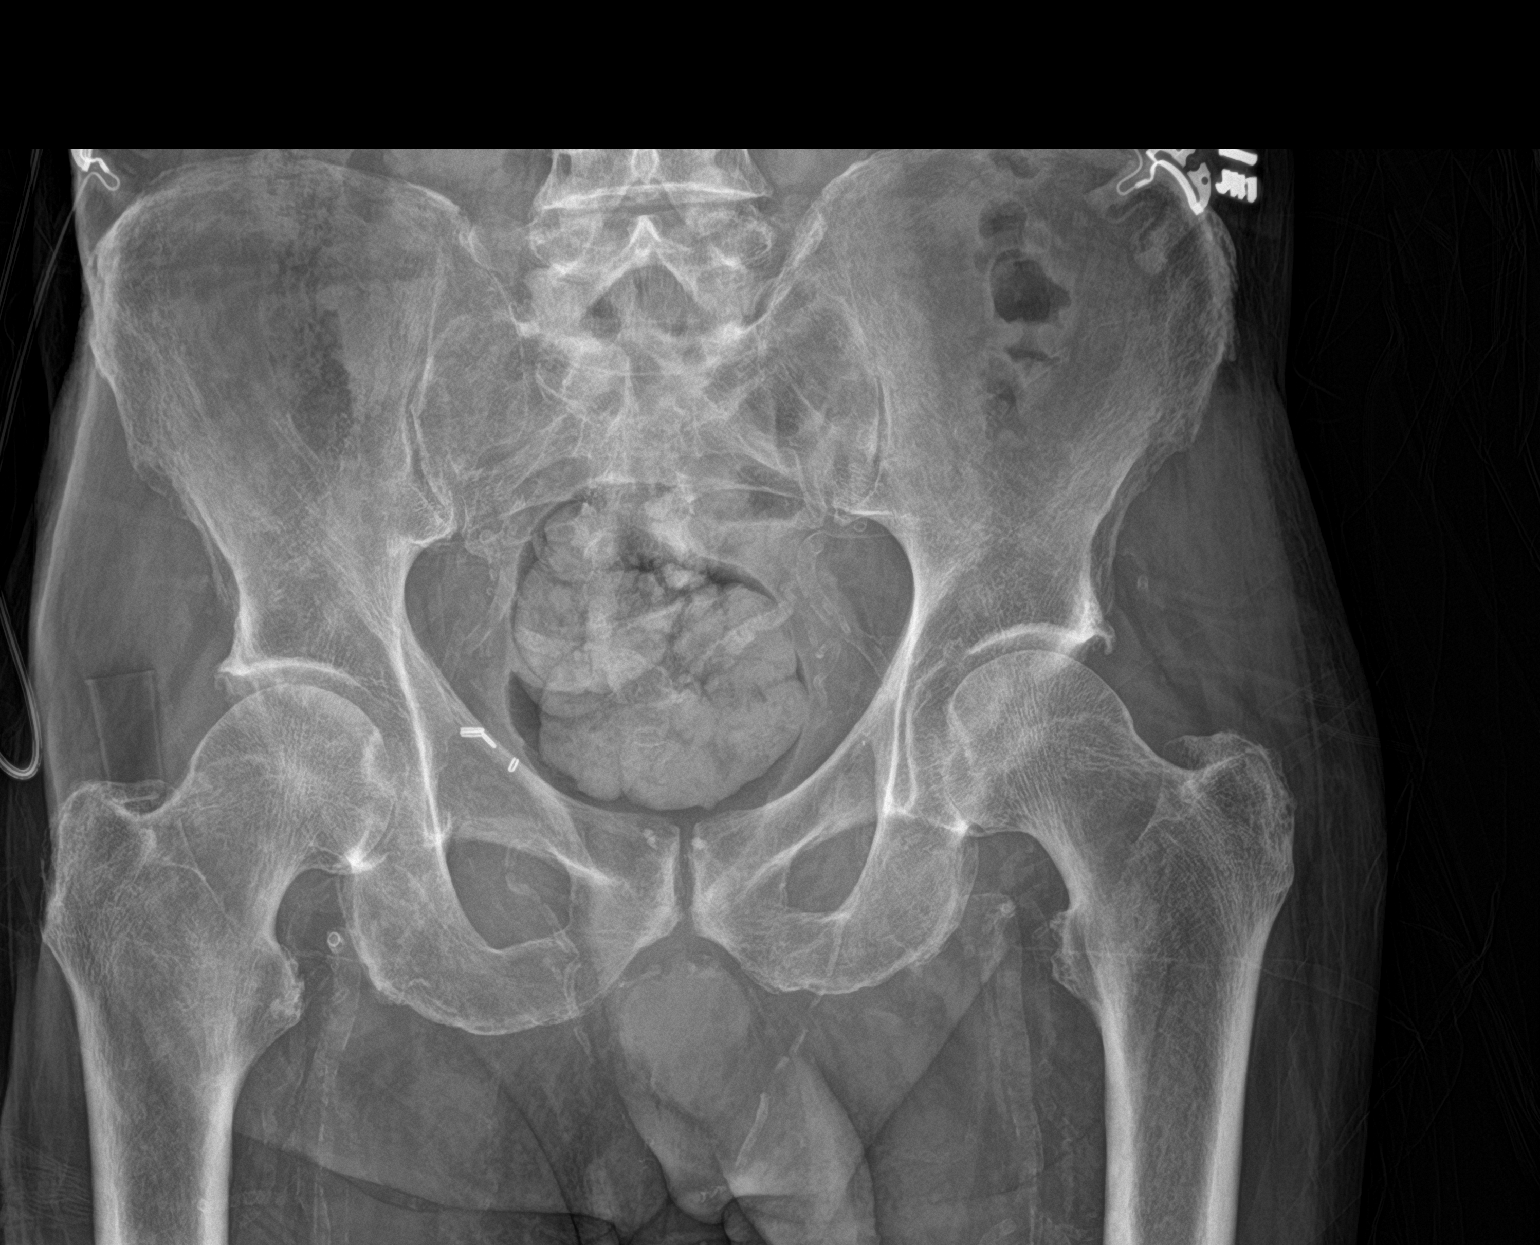

[1 of 1 positions shown; findings below may reference images not displayed]

FINDINGS: Vascular calcifications. Pubic symphysis and rami are intact. SI
joints are non widened. No fracture or malalignment. Mild
degenerative changes.
IMPRESSION: No acute osseous abnormality

## 2020-03-08 IMAGING — US US RENAL
1 series · 14 of 25 positions shown · non-contrast
Comparison: 04/25/2014

CLINICAL DATA: Acute renal failure.

EXAM:
RENAL / URINARY TRACT ULTRASOUND COMPLETE

[Series 1: us renal · 14 of 42 slices shown]
[im 1/42]
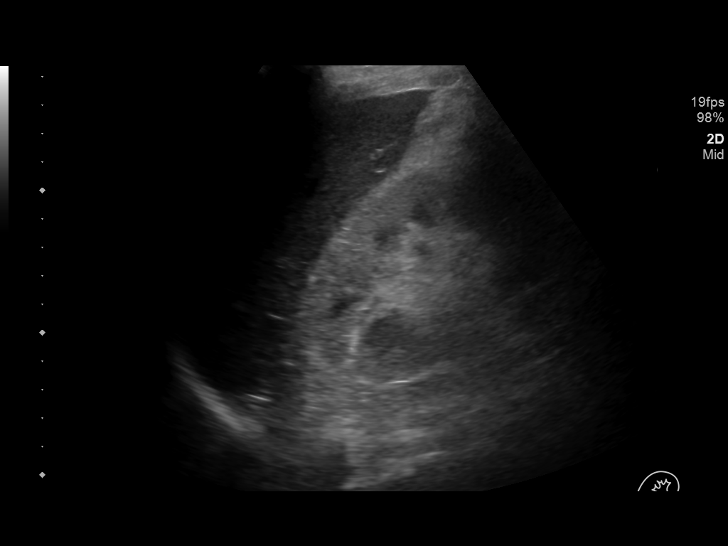
[im 4/42]
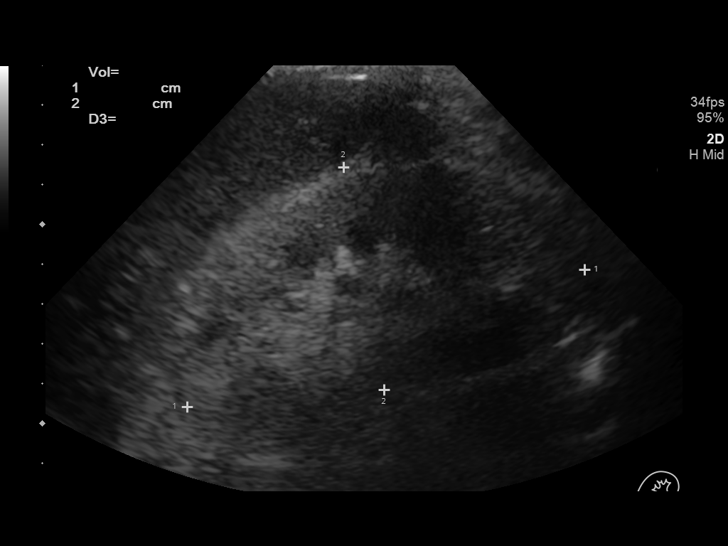
[im 7/42]
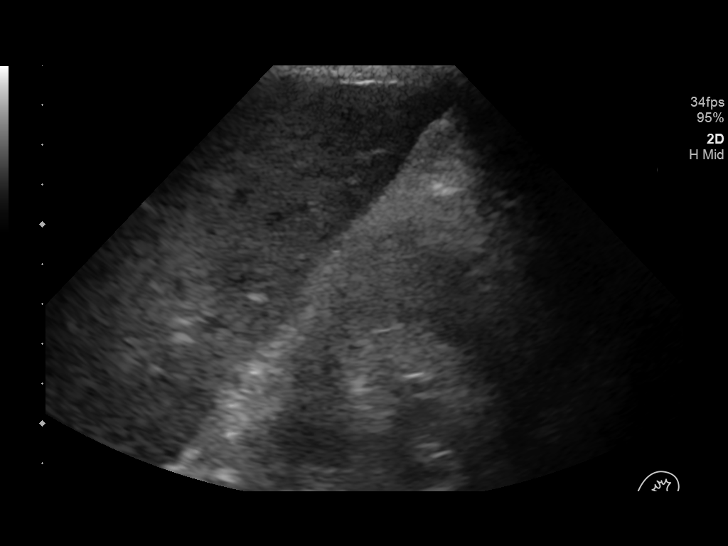
[im 11/42]
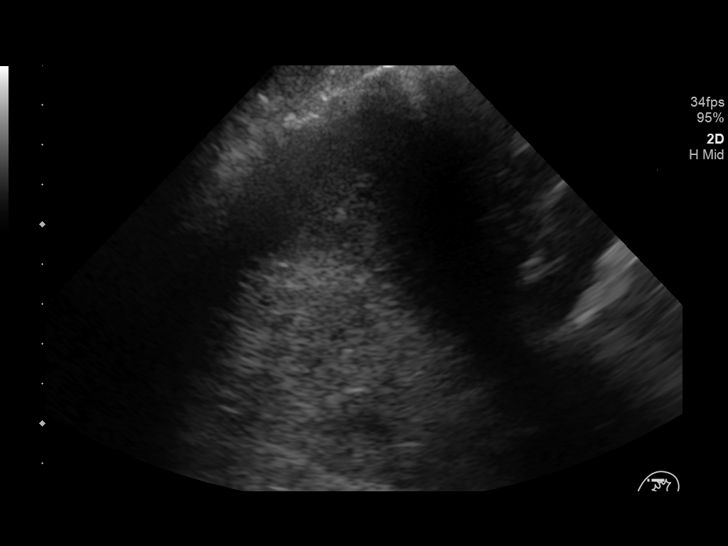
[im 14/42]
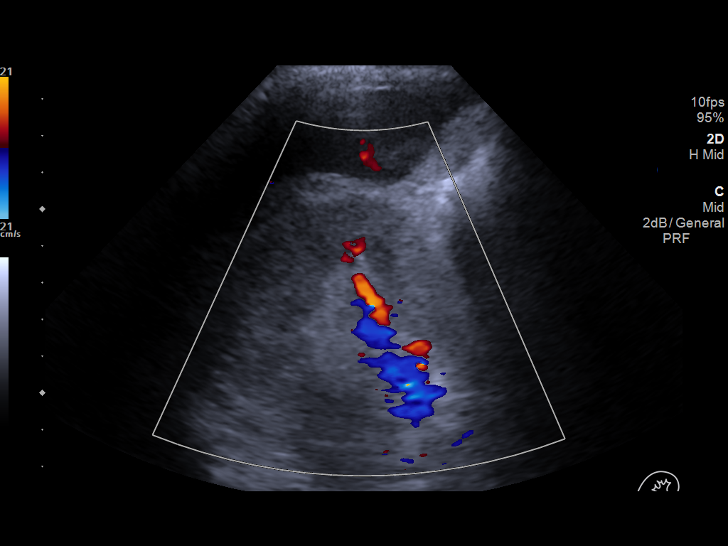
[im 16/42]
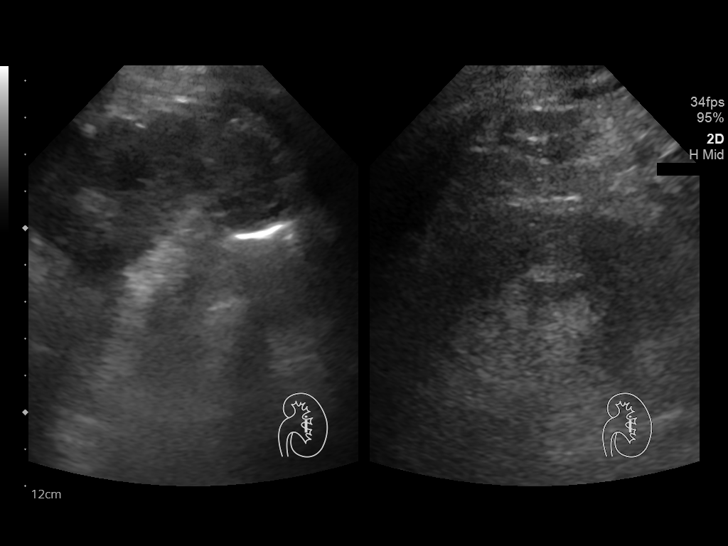
[im 19/42]
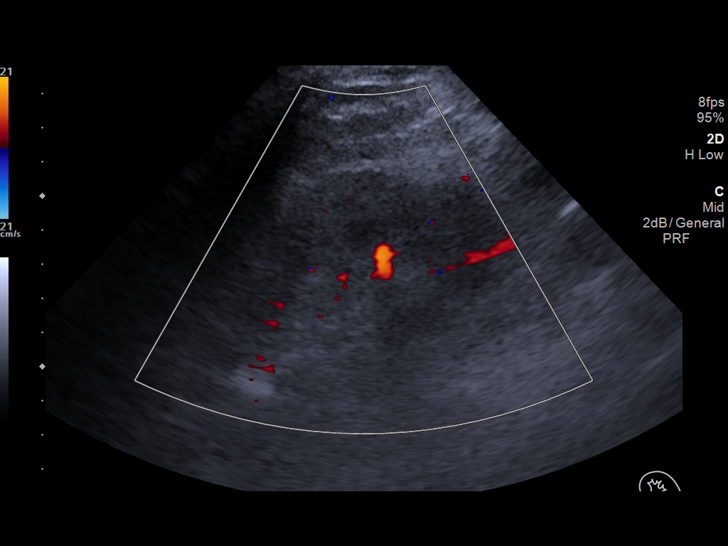
[im 23/42]
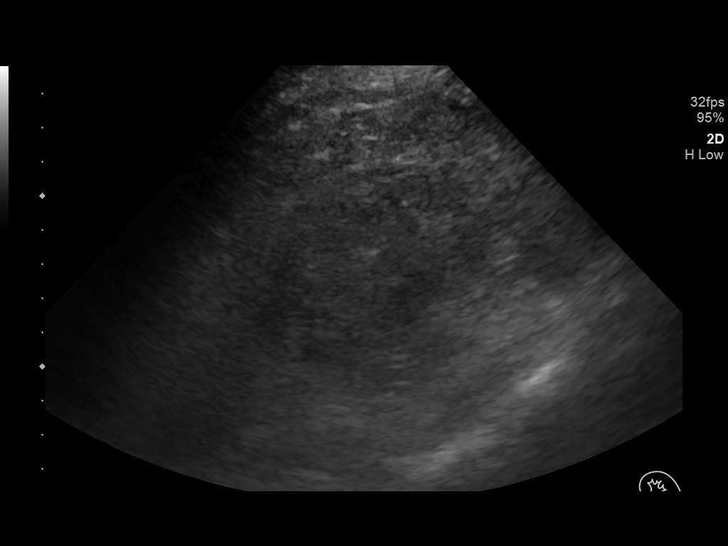
[im 26/42]
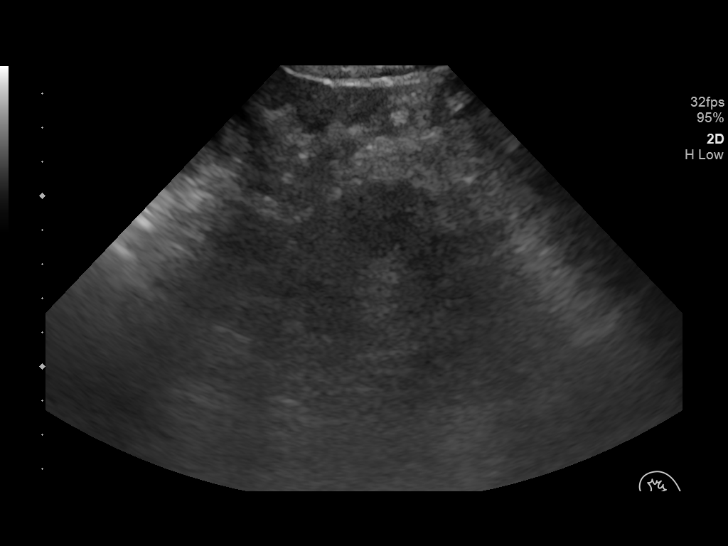
[im 28/42]
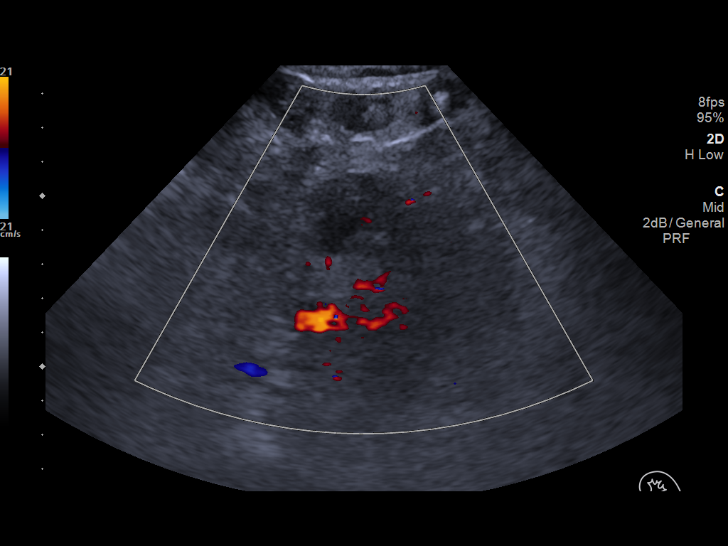
[im 31/42]
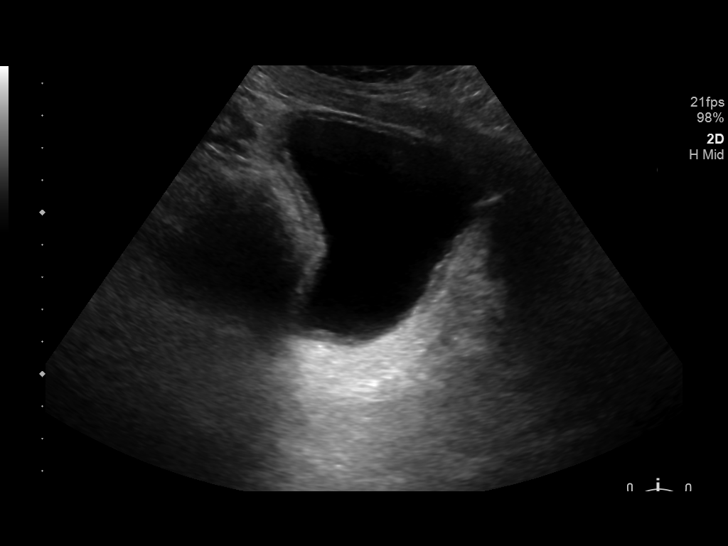
[im 35/42]
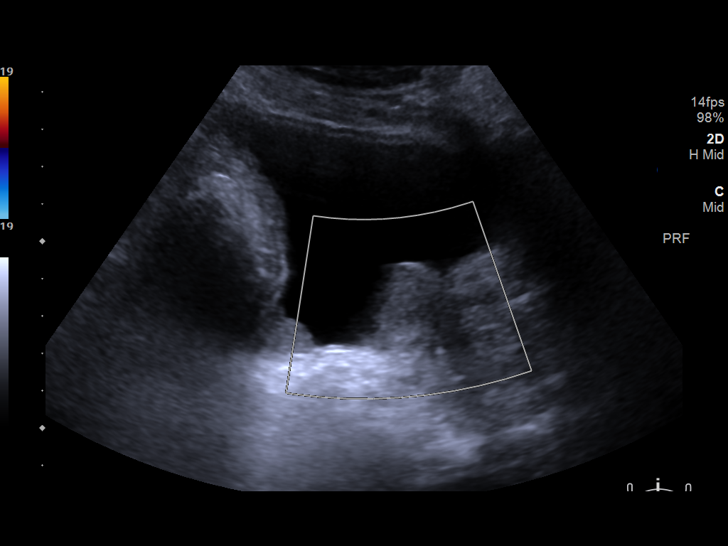
[im 38/42]
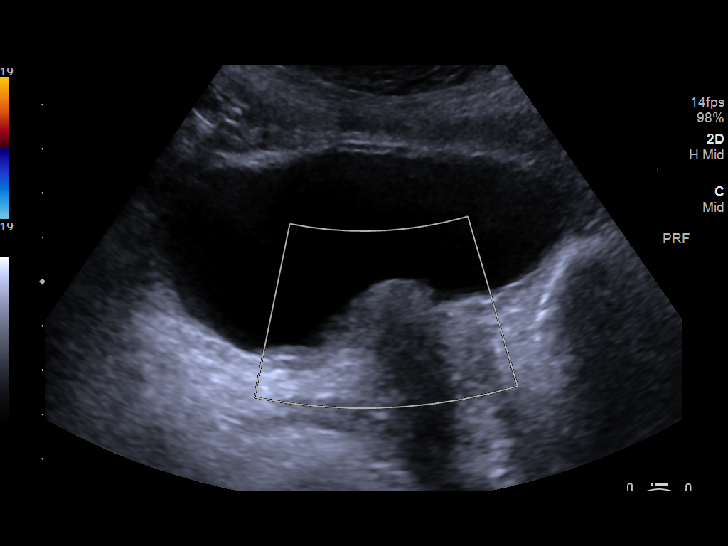
[im 42/42]
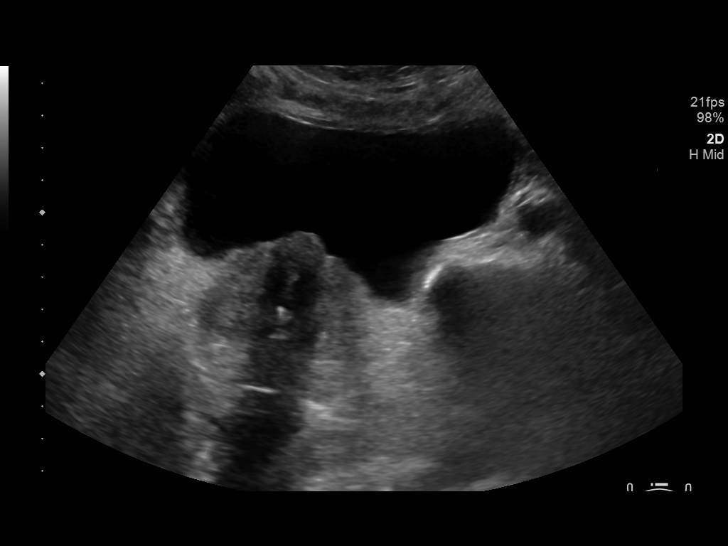

[14 of 25 positions shown; findings below may reference images not displayed]

FINDINGS: Right Kidney:

Renal measurements: 5.7 x 5.1 x 10.6 cm = volume: 160 mL. Mild
increased cortical echogenicity. No mass or hydronephrosis
visualized.

Left Kidney:

Renal measurements: 5.0 x 6.7 x 11.8 cm = volume: 206 mL. Mild
increased cortical echogenicity. No mass or hydronephrosis
visualized.

Bladder:

Appears normal for degree of bladder distention. Impression upon the
bladder base with mild shadowing likely due to adjacent prominent
prostate gland with possible calcification.
IMPRESSION: Normal size kidneys without hydronephrosis. Mild increased cortical
echogenicity which can be seen with medical renal disease.

Mild prominence of the prostate gland with impression upon the
bladder base.
